# Patient Record
Sex: Male | Born: 1950 | Race: White | Hispanic: No | Marital: Married | Smoking: Never smoker
Health system: Southern US, Community
[De-identification: ages and names within clinical notes are randomized; demographics above are authoritative.]

## PROBLEM LIST (undated history)

## (undated) DIAGNOSIS — E7800 Pure hypercholesterolemia, unspecified: Secondary | ICD-10-CM

## (undated) DIAGNOSIS — M1990 Unspecified osteoarthritis, unspecified site: Secondary | ICD-10-CM

## (undated) DIAGNOSIS — K635 Polyp of colon: Secondary | ICD-10-CM

## (undated) DIAGNOSIS — K649 Unspecified hemorrhoids: Secondary | ICD-10-CM

## (undated) DIAGNOSIS — I10 Essential (primary) hypertension: Secondary | ICD-10-CM

---

## 1998-04-21 HISTORY — PX: REPAIR BIFID DIGIT: SUR1161

## 2002-01-21 ENCOUNTER — Encounter: Payer: Self-pay | Admitting: Emergency Medicine

## 2002-01-21 ENCOUNTER — Emergency Department (HOSPITAL_COMMUNITY): Admission: EM | Admit: 2002-01-21 | Discharge: 2002-01-21 | Payer: Self-pay | Admitting: Emergency Medicine

## 2005-04-21 HISTORY — PX: ROTATOR CUFF REPAIR: SHX139

## 2005-07-08 ENCOUNTER — Encounter (HOSPITAL_COMMUNITY): Admission: RE | Admit: 2005-07-08 | Discharge: 2005-08-07 | Payer: Self-pay | Admitting: Preventative Medicine

## 2011-07-28 ENCOUNTER — Encounter (HOSPITAL_COMMUNITY): Payer: Self-pay | Admitting: Pharmacy Technician

## 2011-07-28 NOTE — Patient Instructions (Addendum)
20 Stanley Reyes  07/28/2011   Your procedure is scheduled on:   08/04/2011   Report to [facility] at  1000  AM.  Call this number if you have problems the morning of surgery: [phone]   Remember:   Do not eat food:After Midnight.  May have clear liquids:until Midnight .  Clear liquids include soda, tea, black coffee, apple or grape juice, broth.  Take these medicines the morning of surgery with A SIP OF WATER: none   Do not wear jewelry, make-up or nail polish.  Do not wear lotions, powders, or perfumes. You may wear deodorant.  Do not shave 48 hours prior to surgery.  Do not bring valuables to the hospital.  Contacts, dentures or bridgework may not be worn into surgery.  Leave suitcase in the car. After surgery it may be brought to your room.  For patients admitted to the hospital, checkout time is 11:00 AM the day of discharge.   Patients discharged the day of surgery will not be allowed to drive home.  Name and phone number of your driver: family  Special Instructions: N/A   Please read over the following fact sheets that you were given: Pain Booklet, MRSA Information, Surgical Site Infection Prevention, Anesthesia Post-op Instructions and Care and Recovery After Surgery Cataract A cataract is a clouding of the lens of the eye. When a lens becomes cloudy, vision is reduced based on the degree and nature of the clouding. Many cataracts reduce vision to some degree. Some cataracts make people more near-sighted as they develop. Other cataracts increase glare. Cataracts that are ignored and become worse can sometimes look white. The white color can be seen through the pupil. CAUSES   Aging. However, cataracts may occur at any age, even in newborns.   Certain drugs.   Trauma to the eye.   Certain diseases such as diabetes.   Specific eye diseases such as chronic inflammation inside the eye or a sudden attack of a rare form of glaucoma.   Inherited or acquired medical  problems.  SYMPTOMS   Gradual, progressive drop in vision in the affected eye.   Severe, rapid visual loss. This most often happens when trauma is the cause.  DIAGNOSIS  To detect a cataract, an eye doctor examines the lens. Cataracts are best diagnosed with an exam of the eyes with the pupils enlarged (dilated) by drops.  TREATMENT  For an early cataract, vision may improve by using different eyeglasses or stronger lighting. If that does not help your vision, surgery is the only effective treatment. A cataract needs to be surgically removed when vision loss interferes with your everyday activities, such as driving, reading, or watching TV. A cataract may also have to be removed if it prevents examination or treatment of another eye problem. Surgery removes the cloudy lens and usually replaces it with a substitute lens (intraocular lens, IOL).  At a time when both you and your doctor agree, the cataract will be surgically removed. If you have cataracts in both eyes, only one is usually removed at a time. This allows the operated eye to heal and be out of danger from any possible problems after surgery (such as infection or poor wound healing). In rare cases, a cataract may be doing damage to your eye. In these cases, your caregiver may advise surgical removal right away. The vast majority of people who have cataract surgery have better vision afterward. HOME CARE INSTRUCTIONS  If you are not planning surgery,  you may be asked to do the following:  Use different eyeglasses.   Use stronger or brighter lighting.   Ask your eye doctor about reducing your medicine dose or changing medicines if it is thought that a medicine caused your cataract. Changing medicines does not make the cataract go away on its own.   Become familiar with your surroundings. Poor vision can lead to injury. Avoid bumping into things on the affected side. You are at a higher risk for tripping or falling.   Exercise extreme  care when driving or operating machinery.   Wear sunglasses if you are sensitive to bright light or experiencing problems with glare.  SEEK IMMEDIATE MEDICAL CARE IF:   You have a worsening or sudden vision loss.   You notice redness, swelling, or increasing pain in the eye.   You have a fever.  Document Released: 04/07/2005 Document Revised: 03/27/2011 Document Reviewed: 11/29/2010 [facility] Patient Information 2012 [place], [place].PATIENT INSTRUCTIONS POST-ANESTHESIA  IMMEDIATELY FOLLOWING SURGERY:  Do not drive or operate machinery for the first twenty four hours after surgery.  Do not make any important decisions for twenty four hours after surgery or while taking narcotic pain medications or sedatives.  If you develop intractable nausea and vomiting or a severe headache please notify your doctor immediately.  FOLLOW-UP:  Please make an appointment with your surgeon as instructed. You do not need to follow up with anesthesia unless specifically instructed to do so.  WOUND CARE INSTRUCTIONS (if applicable):  Keep a dry clean dressing on the anesthesia/puncture wound site if there is drainage.  Once the wound has quit draining you may leave it open to air.  Generally you should leave the bandage intact for twenty four hours unless there is drainage.  If the epidural site drains for more than 36-48 hours please call the anesthesia department.  QUESTIONS?:  Please feel free to call your physician or the hospital operator if you have any questions, and they will be happy to assist you.     [facility] Anesthesia Department [address] [place] [phone]

## 2011-07-29 ENCOUNTER — Encounter (HOSPITAL_COMMUNITY)
Admission: RE | Admit: 2011-07-29 | Discharge: 2011-07-29 | Disposition: A | Discharge status: Home / Self Care | Payer: 59 | Source: Ambulatory Visit | Attending: Ophthalmology | Admitting: Ophthalmology

## 2011-07-29 ENCOUNTER — Encounter (HOSPITAL_COMMUNITY): Payer: Self-pay

## 2011-07-29 HISTORY — DX: Essential (primary) hypertension: I10

## 2011-07-29 LAB — HEMOGLOBIN AND HEMATOCRIT, BLOOD
HCT: 44.1 % (ref 39.0–52.0)
Hemoglobin: 15.3 g/dL (ref 13.0–17.0)

## 2011-07-29 LAB — BASIC METABOLIC PANEL
Calcium: 10.4 mg/dL (ref 8.4–10.5)
Creatinine, Ser: 1.25 mg/dL (ref 0.50–1.35)
GFR calc Af Amer: 70 mL/min — ABNORMAL LOW (ref >90)

## 2011-08-01 MED ORDER — LIDOCAINE HCL 3.5 % OP GEL
OPHTHALMIC | Status: AC
  Filled 2011-08-01: qty 5

## 2011-08-01 MED ORDER — TETRACAINE HCL 0.5 % OP SOLN
OPHTHALMIC | Status: AC
  Filled 2011-08-01: qty 2

## 2011-08-04 ENCOUNTER — Encounter (HOSPITAL_COMMUNITY)
Admission: RE | Disposition: A | Discharge status: Home / Self Care | Payer: Self-pay | Source: Ambulatory Visit | Attending: Ophthalmology

## 2011-08-04 ENCOUNTER — Ambulatory Visit (HOSPITAL_COMMUNITY): Payer: 59 | Admitting: Anesthesiology

## 2011-08-04 ENCOUNTER — Encounter (HOSPITAL_COMMUNITY): Payer: Self-pay | Admitting: Anesthesiology

## 2011-08-04 ENCOUNTER — Encounter (HOSPITAL_COMMUNITY): Payer: Self-pay | Admitting: *Deleted

## 2011-08-04 ENCOUNTER — Ambulatory Visit (HOSPITAL_COMMUNITY)
Admission: RE | Admit: 2011-08-04 | Discharge: 2011-08-04 | Disposition: A | Discharge status: Home / Self Care | Payer: 59 | Source: Ambulatory Visit | Attending: Ophthalmology | Admitting: Ophthalmology

## 2011-08-04 DIAGNOSIS — Z01812 Encounter for preprocedural laboratory examination: Secondary | ICD-10-CM | POA: Insufficient documentation

## 2011-08-04 DIAGNOSIS — H2510 Age-related nuclear cataract, unspecified eye: Secondary | ICD-10-CM | POA: Insufficient documentation

## 2011-08-04 DIAGNOSIS — Z01810 Encounter for preprocedural cardiovascular examination: Secondary | ICD-10-CM | POA: Insufficient documentation

## 2011-08-04 DIAGNOSIS — I10 Essential (primary) hypertension: Secondary | ICD-10-CM | POA: Insufficient documentation

## 2011-08-04 HISTORY — PX: CATARACT EXTRACTION W/PHACO: SHX586

## 2011-08-04 SURGERY — PHACOEMULSIFICATION, CATARACT, WITH IOL INSERTION
Anesthesia: Monitor Anesthesia Care | Site: Eye | Laterality: Right | Wound class: Clean

## 2011-08-04 MED ORDER — TETRACAINE HCL 0.5 % OP SOLN
1.0000 [drp] | OPHTHALMIC | Status: AC
  Administered 2011-08-04: 11:00:00 via OPHTHALMIC

## 2011-08-04 MED ORDER — MIDAZOLAM HCL 2 MG/2ML IJ SOLN
INTRAMUSCULAR | Status: AC
  Administered 2011-08-04: 2 mg
  Filled 2011-08-04: qty 2

## 2011-08-04 MED ORDER — TETRACAINE HCL 0.5 % OP SOLN
OPHTHALMIC | Status: AC
  Filled 2011-08-04: qty 2

## 2011-08-04 MED ORDER — GATIFLOXACIN 0.5 % OP SOLN OPTIME - NO CHARGE
1.0000 [drp] | Freq: Once | OPHTHALMIC | Status: AC
  Administered 2011-08-04: 1 [drp] via OPHTHALMIC
  Filled 2011-08-04: qty 2.5

## 2011-08-04 MED ORDER — BSS IO SOLN
INTRAOCULAR | Status: DC | PRN
  Administered 2011-08-04: 15 mL via INTRAOCULAR

## 2011-08-04 MED ORDER — LIDOCAINE 3.5 % OP GEL OPTIME - NO CHARGE
OPHTHALMIC | Status: DC | PRN
  Administered 2011-08-04: 2 [drp] via OPHTHALMIC

## 2011-08-04 MED ORDER — EPINEPHRINE HCL 1 MG/ML IJ SOLN
INTRAMUSCULAR | Status: AC
  Filled 2011-08-04: qty 1

## 2011-08-04 MED ORDER — MIDAZOLAM HCL 2 MG/2ML IJ SOLN
1.0000 mg | INTRAMUSCULAR | Status: DC | PRN
Start: 2011-08-04 — End: 2011-08-04

## 2011-08-04 MED ORDER — GATIFLOXACIN 0.5 % OP SOLN OPTIME - NO CHARGE
OPHTHALMIC | Status: DC | PRN
  Administered 2011-08-04: 1 [drp] via OPHTHALMIC

## 2011-08-04 MED ORDER — FENTANYL CITRATE 0.05 MG/ML IJ SOLN: 25.0000 ug | INTRAMUSCULAR | Status: DC | PRN

## 2011-08-04 MED ORDER — TETRACAINE 0.5 % OP SOLN OPTIME - NO CHARGE
OPHTHALMIC | Status: DC | PRN
  Administered 2011-08-04: 2 [drp] via OPHTHALMIC

## 2011-08-04 MED ORDER — EPINEPHRINE HCL 1 MG/ML IJ SOLN
INTRAOCULAR | Status: DC | PRN
  Administered 2011-08-04: 11:00:00

## 2011-08-04 MED ORDER — MOXIFLOXACIN HCL 0.5 % OP SOLN - NO CHARGE
1.0000 [drp] | Freq: Once | OPHTHALMIC | Status: AC
  Administered 2011-08-04: 1 [drp] via OPHTHALMIC
  Filled 2011-08-04: qty 3

## 2011-08-04 MED ORDER — NA HYALUR & NA CHOND-NA HYALUR 0.55-0.5 ML IO KIT
PACK | INTRAOCULAR | Status: DC | PRN
  Administered 2011-08-04: 1 via OPHTHALMIC

## 2011-08-04 MED ORDER — KETOROLAC TROMETHAMINE 0.4 % OP SOLN - NO CHARGE
1.0000 [drp] | Freq: Once | OPHTHALMIC | Status: AC
  Administered 2011-08-04: 1 [drp] via OPHTHALMIC
  Filled 2011-08-04: qty 5

## 2011-08-04 MED ORDER — LIDOCAINE HCL 3.5 % OP GEL
OPHTHALMIC | Status: AC
  Filled 2011-08-04: qty 5

## 2011-08-04 MED ORDER — ONDANSETRON HCL 4 MG/2ML IJ SOLN: 4.0000 mg | Freq: Once | INTRAMUSCULAR | Status: DC | PRN

## 2011-08-04 MED ORDER — LACTATED RINGERS IV SOLN
INTRAVENOUS | Status: DC
  Administered 2011-08-04: 10:00:00 via INTRAVENOUS

## 2011-08-04 SURGICAL SUPPLY — 27 items
CAPSULAR TENSION RING-AMO (OPHTHALMIC RELATED) IMPLANT
CLOTH BEACON ORANGE TIMEOUT ST (SAFETY) ×1 IMPLANT
GLOVE BIO SURGEON STRL SZ7.5 (GLOVE) IMPLANT
GLOVE BIOGEL M 6.5 STRL (GLOVE) IMPLANT
GLOVE BIOGEL PI IND STRL 6.5 (GLOVE) IMPLANT
GLOVE BIOGEL PI IND STRL 7.0 (GLOVE) IMPLANT
GLOVE BIOGEL PI INDICATOR 6.5 (GLOVE) ×1
GLOVE BIOGEL PI INDICATOR 7.0 (GLOVE) ×1
GLOVE ECLIPSE 6.5 STRL STRAW (GLOVE) IMPLANT
GLOVE ECLIPSE 7.5 STRL STRAW (GLOVE) IMPLANT
GLOVE EXAM NITRILE LRG STRL (GLOVE) IMPLANT
GLOVE EXAM NITRILE MD LF STRL (GLOVE) IMPLANT
GLOVE SKINSENSE NS SZ6.5 (GLOVE)
GLOVE SKINSENSE NS SZ7.0 (GLOVE)
GLOVE SKINSENSE STRL SZ6.5 (GLOVE) IMPLANT
GLOVE SKINSENSE STRL SZ7.0 (GLOVE) IMPLANT
INST SET CATARACT ~~LOC~~ (KITS) ×2 IMPLANT
KIT VITRECTOMY (OPHTHALMIC RELATED) IMPLANT
PAD ARMBOARD 7.5X6 YLW CONV (MISCELLANEOUS) ×1 IMPLANT
PROC W NO LENS (INTRAOCULAR LENS)
PROC W SPEC LENS (INTRAOCULAR LENS)
PROCESS W NO LENS (INTRAOCULAR LENS) IMPLANT
PROCESS W SPEC LENS (INTRAOCULAR LENS) IMPLANT
RING MALYGIN (MISCELLANEOUS) IMPLANT
SIGHTPATH CAT PROC W REG LENS (Ophthalmic Related) ×2 IMPLANT
VISCOELASTIC ADDITIONAL (OPHTHALMIC RELATED) IMPLANT
WATER STERILE IRR 250ML POUR (IV SOLUTION) ×1 IMPLANT

## 2011-08-04 NOTE — Anesthesia Preprocedure Evaluation (Signed)
Anesthesia Evaluation  Patient identified by MRN, date of birth, ID band Patient awake    Reviewed: Allergy & Precautions, H&P , NPO status , Patient's Chart, lab work & pertinent test results  Airway Mallampati: II      Dental  (+) Teeth Intact   Pulmonary  breath sounds clear to auscultation        Cardiovascular hypertension (stopped his meds), Rhythm:Regular     Neuro/Psych    GI/Hepatic   Endo/Other    Renal/GU      Musculoskeletal   Abdominal   Peds  Hematology   Anesthesia Other Findings   Reproductive/Obstetrics                           Anesthesia Physical Anesthesia Plan  ASA: II  Anesthesia Plan: MAC   Post-op Pain Management:    Induction: Intravenous  Airway Management Planned: Nasal Cannula  Additional Equipment:   Intra-op Plan:   Post-operative Plan:   Informed Consent: I have reviewed the patients History and Physical, chart, labs and discussed the procedure including the risks, benefits and alternatives for the proposed anesthesia with the patient or authorized representative who has indicated his/her understanding and acceptance.     Plan Discussed with:   Anesthesia Plan Comments:         Anesthesia Quick Evaluation

## 2011-08-04 NOTE — Anesthesia Procedure Notes (Signed)
Procedure Name: MAC Date/Time: 08/04/2011 10:49 AM Performed by: Franco Nones Pre-anesthesia Checklist: Patient identified, Emergency Drugs available, Suction available, Timeout performed and Patient being monitored Patient Re-evaluated:Patient Re-evaluated prior to inductionOxygen Delivery Method: Nasal Cannula

## 2011-08-04 NOTE — Discharge Instructions (Signed)
BERYL BALZ 08/04/2011 Dr. Lita Mains Post operative Instructions for Cataract Patients  These instructions are for Stanley Reyes and pertain to the operative eye.  1.  Resume your normal diet and previous oral medicines.  2. Your Follow-up appointment is at Dr. Lita Mains' office in [place] on 08/05/11 at 9:15.  3. You may leave the hospital when your driver is present and your nurse releases you.  4. Begin Pred Forte (prednisolone acetate 1%), Acular LS (ketorolac tromethamine .4%) and Gatifloxacin 0.5% eye drops; 1 drop each 4 times daily to operative eye. Begin 3 hours after discharge from Short Stay Unit.  Moxifloxacin 0.5% may be substituted for Gatifloxacin using the same instructions.  5. Page Dr. Lita Mains via beeper [phone] for significant pain in or around operative eye that is not relieved by Tylenol.  6. If you took Plavix before surgery, restart it at the usual dose on the evening of surgery.  7. Wear dark glasses as necessary for excessive light sensitivity.  8. Do no forcefully rub you your operative eye.  9. Keep your operative eye dry for 1 week. You may gently clean your eyelids with a damp washcloth.  10. You may resume normal occupational activities in one week and resume driving as tolerated after the first post operative visit.  11. It is normal to have blurred vision and a scratchy sensation following surgery.  Dr. Lita Mains: [phone]

## 2011-08-04 NOTE — Anesthesia Postprocedure Evaluation (Signed)
  Anesthesia Post-op Note  Patient: Stanley Reyes  Procedure(s) Performed: Procedure(s) (LRB): CATARACT EXTRACTION PHACO AND INTRAOCULAR LENS PLACEMENT (IOC) (Right)  Patient Location:  Short Stay  Anesthesia Type: MAC  Level of Consciousness: awake  Airway and Oxygen Therapy: Patient Spontanous Breathing  Post-op Pain: none  Post-op Assessment: Post-op Vital signs reviewed, Patient's Cardiovascular Status Stable, Respiratory Function Stable, Patent Airway, No signs of Nausea or vomiting and Pain level controlled  Post-op Vital Signs: Reviewed and stable  Complications: No apparent anesthesia complications

## 2011-08-04 NOTE — Op Note (Signed)
See op note created in another program today

## 2011-08-04 NOTE — H&P (Signed)
I have reviewed the pre printed H&P, the patient was re-examined, and I have identified no significant interval changes in the patient's medical condition.  There is no change in the plan of care since the history and physical of record. 

## 2011-08-04 NOTE — Transfer of Care (Signed)
Immediate Anesthesia Transfer of Care Note  Patient: Stanley Reyes  Procedure(s) Performed: Procedure(s) (LRB): CATARACT EXTRACTION PHACO AND INTRAOCULAR LENS PLACEMENT (IOC) (Right)  Patient Location: Shortstay  Anesthesia Type: MAC  Level of Consciousness: awake  Airway & Oxygen Therapy: Patient Spontanous Breathing   Post-op Assessment: Report given to PACU RN, Post -op Vital signs reviewed and stable and Patient moving all extremities  Post vital signs: Reviewed and stable  Complications: No apparent anesthesia complications

## 2011-08-04 NOTE — Brief Op Note (Signed)
DATE: 08/04/2011   TIME: 11:18 AM   PATIENT:  Corrie Mckusick, 61 y.o., male   PRE-OPERATIVE DIAGNOSIS: nuclear cataract right eye    POST-OPERATIVE DIAGNOSIS:  nuclear cataract right eye    PROCEDURE(S):  Procedure(s): CATARACT EXTRACTION PHACO AND INTRAOCULAR LENS PLACEMENT (IOC)    SURGEON:  Surgeon(s) and Role:    * Susa Simmonds, MD - Primary    ASSISTANT:  Cyndie Chime, RN - Circulator Hurshel Party, CST - Scrub Person Kirstie Peri, RN - Circulator Assistant    ANESTHESIA: Topical and MAC   FINDINGS:  Dense nuclear cataract   IMPLANTS:  Implant Name Type Inv. Item Serial No. Manufacturer Lot No. LRB No. Used Action  SIGHTPATH CAT PROC W REG LENS - [id number] Ophthalmic Related SIGHTPATH CAT PROC W REG LENS [id number] SIGHTPATH   Right 1 Implanted      INDICATIONS: See H&P      PLAN OF CARE:  Discharge home per discharge instructions

## 2011-08-06 ENCOUNTER — Encounter (HOSPITAL_COMMUNITY): Payer: Self-pay | Admitting: Ophthalmology

## 2012-02-10 ENCOUNTER — Telehealth (INDEPENDENT_AMBULATORY_CARE_PROVIDER_SITE_OTHER): Payer: Self-pay | Admitting: *Deleted

## 2012-02-10 ENCOUNTER — Other Ambulatory Visit (INDEPENDENT_AMBULATORY_CARE_PROVIDER_SITE_OTHER): Payer: Self-pay | Admitting: *Deleted

## 2012-02-10 ENCOUNTER — Encounter (INDEPENDENT_AMBULATORY_CARE_PROVIDER_SITE_OTHER): Payer: Self-pay | Admitting: *Deleted

## 2012-02-10 DIAGNOSIS — Z1211 Encounter for screening for malignant neoplasm of colon: Secondary | ICD-10-CM

## 2012-02-10 NOTE — Telephone Encounter (Addendum)
Patient needs movi prep  PCP/Requesting MD: scott luking  Name & DOB: Stanley Reyes 1950-05-08   Procedure: tcs   Reason/Indication:  screening  Has patient had this procedure before?  no  If so, when, by whom and where?    Is there a family history of colon cancer?  no  Who?  What age when diagnosed?    Is patient diabetic?   no      Does patient have prosthetic heart valve?  no  Do you have a pacemaker?  no  Has patient had joint replacement within last 12 months?  no  Is patient on Coumadin, Plavix and/or Aspirin? yes  Medications: asa 81 mg daily, enalapril 10 mg daily, fenofibrate 160 mg daily, fish oil, vitamin  Allergies: nkda  Medication Adjustment: asa 2 days  Procedure date & time: 03/05/12 at 12:25

## 2012-02-12 MED ORDER — PEG-KCL-NACL-NASULF-NA ASC-C 100 G PO SOLR: 1.0000 | Freq: Once | ORAL | Status: DC

## 2012-02-12 NOTE — Telephone Encounter (Signed)
agree

## 2012-03-01 ENCOUNTER — Encounter (HOSPITAL_COMMUNITY): Payer: Self-pay | Admitting: Pharmacy Technician

## 2012-03-05 ENCOUNTER — Ambulatory Visit (HOSPITAL_COMMUNITY)
Admission: RE | Admit: 2012-03-05 | Discharge: 2012-03-05 | Disposition: A | Discharge status: Home / Self Care | Payer: 59 | Source: Ambulatory Visit | Attending: Internal Medicine | Admitting: Internal Medicine

## 2012-03-05 ENCOUNTER — Encounter (HOSPITAL_COMMUNITY): Payer: Self-pay | Admitting: *Deleted

## 2012-03-05 ENCOUNTER — Encounter (HOSPITAL_COMMUNITY)
Admission: RE | Disposition: A | Discharge status: Home / Self Care | Payer: Self-pay | Source: Ambulatory Visit | Attending: Internal Medicine

## 2012-03-05 DIAGNOSIS — K5730 Diverticulosis of large intestine without perforation or abscess without bleeding: Secondary | ICD-10-CM

## 2012-03-05 DIAGNOSIS — K644 Residual hemorrhoidal skin tags: Secondary | ICD-10-CM

## 2012-03-05 DIAGNOSIS — I10 Essential (primary) hypertension: Secondary | ICD-10-CM | POA: Insufficient documentation

## 2012-03-05 DIAGNOSIS — D126 Benign neoplasm of colon, unspecified: Secondary | ICD-10-CM

## 2012-03-05 DIAGNOSIS — Z1211 Encounter for screening for malignant neoplasm of colon: Secondary | ICD-10-CM

## 2012-03-05 HISTORY — PX: COLONOSCOPY: SHX5424

## 2012-03-05 HISTORY — DX: Unspecified hemorrhoids: K64.9

## 2012-03-05 HISTORY — DX: Pure hypercholesterolemia, unspecified: E78.00

## 2012-03-05 SURGERY — COLONOSCOPY
Anesthesia: Moderate Sedation

## 2012-03-05 MED ORDER — MEPERIDINE HCL 50 MG/ML IJ SOLN
INTRAMUSCULAR | Status: AC
  Filled 2012-03-05: qty 1

## 2012-03-05 MED ORDER — STERILE WATER FOR IRRIGATION IR SOLN
Status: DC | PRN
  Administered 2012-03-05: 12:00:00

## 2012-03-05 MED ORDER — MEPERIDINE HCL 50 MG/ML IJ SOLN
INTRAMUSCULAR | Status: DC | PRN
  Administered 2012-03-05 (×2): 25 mg via INTRAVENOUS

## 2012-03-05 MED ORDER — MIDAZOLAM HCL 5 MG/5ML IJ SOLN
INTRAMUSCULAR | Status: DC | PRN
  Administered 2012-03-05 (×3): 2 mg via INTRAVENOUS

## 2012-03-05 MED ORDER — MIDAZOLAM HCL 5 MG/5ML IJ SOLN
INTRAMUSCULAR | Status: AC
  Filled 2012-03-05: qty 10

## 2012-03-05 MED ORDER — SODIUM CHLORIDE 0.45 % IV SOLN
INTRAVENOUS | Status: DC
  Administered 2012-03-05: 12:00:00 via INTRAVENOUS

## 2012-03-05 NOTE — Op Note (Signed)
COLONOSCOPY PROCEDURE REPORT  PATIENT:  [name]  MR#:  [id number] Birthdate:  1950/06/12, 61 y.o., male Endoscopist:  Dr. Malissa Hippo, MD Referred By:  Dr. Lilyan Punt, MD Procedure Date: 03/05/2012  Procedure:   Colonoscopy snare polypectomy.  Indications:  Patient is 61 year-old Caucasian male was undergoing average risk screening colonoscopy.  Informed Consent:  The procedure and risks were reviewed with the patient and informed consent was obtained.  Medications:  Demerol 50 mg IV Versed 6 mg IV  Description of procedure:  After a digital rectal exam was performed, that colonoscope was advanced from the anus through the rectum and colon to the area of the cecum, ileocecal valve and appendiceal orifice. The cecum was deeply intubated. These structures were well-seen and photographed for the record. From the level of the cecum and ileocecal valve, the scope was slowly and cautiously withdrawn. The mucosal surfaces were carefully surveyed utilizing scope tip to flexion to facilitate fold flattening as needed. The scope was pulled down into the rectum where a thorough exam including retroflexion was performed.  Findings:   Prep satisfactory except he had stool coating cecal mucosa. Landmarks were well-seen. Scattered diverticula throughout the colon. Three 6 mm polyps were snared and submitted together. Two were located at ascending colon and the third one was at sigmoid colon. Another small polyp at sigmoid colon was coagulated. Normal rectal mucosa. Hemorrhoids below the dentate line.  Therapeutic/Diagnostic Maneuvers Performed:  The above  Complications:  None  Cecal Withdrawal Time:  25 minutes  Impression:  Examination performed to cecum. Pancolonic diverticulosis. Three polyps were snared submitted together about 6 mm each. Two of these are located at ascending colon and third one was at sigmoid colon. Fourth small polyp was coagulated. External  hemorrhoids.  Recommendations:  Standard instructions given. I will be contacting patient with biopsy results and further recommendations  Drayson Dorko U  03/05/2012 1:03 PM  CC: Dr. Lilyan Punt, MD & Dr. Bonnetta Barry ref. provider found

## 2012-03-05 NOTE — H&P (Signed)
Stanley Reyes is an 61 y.o. male.   Chief Complaint: Patient is here for colonoscopy. HPI: Patient is 61 year old Caucasian male was undergoing colonoscopy. He denies rectal bleeding change in his bowel habits or abdominal pain. Family history is negative for colorectal carcinoma. This is patient's first exam.  Past Medical History  Diagnosis Date  . Hypertension   . Hypercholesteremia   . Hemorrhoids     Past Surgical History  Procedure Date  . Rotator cuff repair 2007  . Repair bifid digit 2000    repair of 3rd and 4th digit.    . Cataract extraction w/phaco 08/04/2011    Procedure: CATARACT EXTRACTION PHACO AND INTRAOCULAR LENS PLACEMENT (IOC);  Surgeon: Susa Simmonds, MD;  Location: [facility];  Service: Ophthalmology;  Laterality: Right;  CDE:  5.60    Family History  Problem Relation Age of Onset  . Colon cancer Neg Hx    Social History:  reports that he has never smoked. He does not have any smokeless tobacco history on file. He reports that he drinks about 3.6 ounces of alcohol per week. He reports that he does not use illicit drugs.  Allergies: No Known Allergies  Medications Prior to Admission  Medication Sig Dispense Refill  . aspirin EC 81 MG tablet Take 81 mg by mouth every morning.      . enalapril (VASOTEC) 10 MG tablet Take 10 mg by mouth daily.      . fenofibrate 160 MG tablet Take 160 mg by mouth daily.      . fish oil-omega-3 fatty acids 1000 MG capsule Take 2 g by mouth every morning.      . Multiple Vitamin (MULITIVITAMIN WITH MINERALS) TABS Take 1 tablet by mouth every morning.      . peg 3350 powder (MOVIPREP) 100 G SOLR Take 1 kit (100 g total) by mouth once.  1 kit  0  . pravastatin (PRAVACHOL) 20 MG tablet Take 20 mg by mouth daily.        No results found for this or any previous visit (from the past 48 hour(s)). No results found.  ROS  Blood pressure 144/87, pulse 67, temperature 97.6 F (36.4 C), resp. rate 23, height 5\' 11"  (1.803 m),  weight 190 lb (86.183 kg), SpO2 100.00%. Physical Exam  Constitutional: He appears well-developed and well-nourished.  HENT:  Mouth/Throat: Oropharynx is clear and moist.  Eyes: Conjunctivae normal are normal. No scleral icterus.  Neck: No thyromegaly present.  Cardiovascular: Normal rate, regular rhythm and normal heart sounds.   No murmur heard. Respiratory: Effort normal.  GI: Soft. He exhibits no distension and no mass. There is no tenderness.  Lymphadenopathy:    He has no cervical adenopathy.  Neurological: He is alert.  Skin: Skin is warm and dry.     Assessment/Plan Average risk screening colonoscopy.  Stanley Reyes U 03/05/2012, 12:17 PM

## 2012-03-09 ENCOUNTER — Encounter (HOSPITAL_COMMUNITY): Payer: Self-pay | Admitting: Internal Medicine

## 2012-03-17 ENCOUNTER — Encounter (INDEPENDENT_AMBULATORY_CARE_PROVIDER_SITE_OTHER): Payer: Self-pay | Admitting: *Deleted

## 2012-11-30 ENCOUNTER — Telehealth: Payer: Self-pay | Admitting: Family Medicine

## 2012-11-30 NOTE — Telephone Encounter (Signed)
Need bw papers for PE on 8/27

## 2012-12-01 ENCOUNTER — Other Ambulatory Visit: Payer: Self-pay

## 2012-12-01 DIAGNOSIS — Z125 Encounter for screening for malignant neoplasm of prostate: Secondary | ICD-10-CM

## 2012-12-01 DIAGNOSIS — E785 Hyperlipidemia, unspecified: Secondary | ICD-10-CM

## 2012-12-01 DIAGNOSIS — Z79899 Other long term (current) drug therapy: Secondary | ICD-10-CM

## 2012-12-01 NOTE — Telephone Encounter (Signed)
Metabolic 7, lipid, liver, PSA 

## 2012-12-01 NOTE — Telephone Encounter (Signed)
Blood work papers ready for pickup. Left message on voicemail notifying patient.

## 2012-12-06 LAB — HEPATIC FUNCTION PANEL
AST: 16 U/L (ref 0–37)
Albumin: 4.4 g/dL (ref 3.5–5.2)
Total Bilirubin: 0.5 mg/dL (ref 0.3–1.2)

## 2012-12-06 LAB — BASIC METABOLIC PANEL
CO2: 29 mEq/L (ref 19–32)
Calcium: 9.7 mg/dL (ref 8.4–10.5)
Chloride: 105 mEq/L (ref 96–112)
Potassium: 4 mEq/L (ref 3.5–5.3)
Sodium: 139 mEq/L (ref 135–145)

## 2012-12-06 LAB — LIPID PANEL
HDL: 38 mg/dL — ABNORMAL LOW (ref >39)
LDL Cholesterol: 85 mg/dL (ref 0–99)
Total CHOL/HDL Ratio: 3.6 Ratio
VLDL: 15 mg/dL (ref 0–40)

## 2012-12-15 ENCOUNTER — Encounter: Payer: Self-pay | Admitting: Family Medicine

## 2012-12-15 ENCOUNTER — Ambulatory Visit (INDEPENDENT_AMBULATORY_CARE_PROVIDER_SITE_OTHER): Payer: 59 | Admitting: Family Medicine

## 2012-12-15 VITALS — BP 142/98 | Ht 70.0 in | Wt 195.2 lb

## 2012-12-15 DIAGNOSIS — Z0000 Encounter for general adult medical examination without abnormal findings: Secondary | ICD-10-CM

## 2012-12-15 DIAGNOSIS — R7303 Prediabetes: Secondary | ICD-10-CM | POA: Insufficient documentation

## 2012-12-15 DIAGNOSIS — I10 Essential (primary) hypertension: Secondary | ICD-10-CM

## 2012-12-15 DIAGNOSIS — E785 Hyperlipidemia, unspecified: Secondary | ICD-10-CM

## 2012-12-15 DIAGNOSIS — R739 Hyperglycemia, unspecified: Secondary | ICD-10-CM

## 2012-12-15 DIAGNOSIS — N289 Disorder of kidney and ureter, unspecified: Secondary | ICD-10-CM

## 2012-12-15 DIAGNOSIS — R7309 Other abnormal glucose: Secondary | ICD-10-CM

## 2012-12-15 NOTE — Progress Notes (Signed)
  Subjective:    Patient ID: Stanley Reyes, male    DOB: [birth year], 62 y.o.   MRN: [id number]  HPI  Here today for annual physical.  No concerns.  The patient comes in today for a wellness visit.  A review of their health history was completed.  A review of medications was also completed. Any necessary refills were discussed. Sensible healthy diet was discussed. Importance of minimizing excessive salt and carbohydrates was also discussed. Safety was stressed including driving, activities at work and at home where applicable. Importance of regular physical activity for overall health was discussed. Preventative measures appropriate for age were discussed. Time was spent with the patient discussing any concerns they have about their well-being.    Review of Systems  Constitutional: Negative for fever, activity change and appetite change.  HENT: Negative for congestion, rhinorrhea and neck pain.   Eyes: Negative for discharge.  Respiratory: Negative for cough and wheezing.   Cardiovascular: Negative for chest pain.  Gastrointestinal: Negative for vomiting, abdominal pain and blood in stool.  Genitourinary: Negative for frequency and difficulty urinating.  Skin: Negative for rash.  Allergic/Immunologic: Negative for environmental allergies and food allergies.  Neurological: Negative for weakness and headaches.  Psychiatric/Behavioral: Negative for agitation.       Objective:   Physical Exam  Constitutional: He appears well-developed and well-nourished.  HENT:  Head: Normocephalic and atraumatic.  Right Ear: External ear normal.  Left Ear: External ear normal.  Nose: Nose normal.  Mouth/Throat: Oropharynx is clear and moist.  Eyes: EOM are normal. Pupils are equal, round, and reactive to light.  Neck: Normal range of motion. Neck supple. No thyromegaly present.  Cardiovascular: Normal rate, regular rhythm and normal heart sounds.   No murmur heard. Pulmonary/Chest:  Effort normal and breath sounds normal. No respiratory distress. He has no wheezes.  Abdominal: Soft. Bowel sounds are normal. He exhibits no distension and no mass. There is no tenderness.  Genitourinary: Rectum normal, prostate normal and penis normal.  Musculoskeletal: Normal range of motion. He exhibits no edema.  Lymphadenopathy:    He has no cervical adenopathy.  Neurological: He is alert. He exhibits normal muscle tone.  Skin: Skin is warm and dry. No erythema.  Psychiatric: He has a normal mood and affect. His behavior is normal. Judgment normal.          Assessment & Plan:  #1 a wellness-discussed importance of regular exercise watching diet safety. Patient up-to-date on colonoscopy. #2 HTN patient's blood pressure tends to run higher in the office when he checks at home looks good continue current medications #3 renal insufficiency recheck metabolic 7 in a few weeks to see if this is a true trend or not. May need different blood pressure medicine. #4 prediabetes-and urged the importance of low starch diet. Recheck A1c in 3 months. #5 hyperlipidemia continue current measures but patient will cut down to 1 fish oil per day.

## 2013-01-12 LAB — BASIC METABOLIC PANEL
BUN: 29 mg/dL — ABNORMAL HIGH (ref 6–23)
Chloride: 109 mEq/L (ref 96–112)
Potassium: 4.2 mEq/L (ref 3.5–5.3)

## 2013-01-17 MED ORDER — AMLODIPINE BESYLATE 5 MG PO TABS: 5.0000 mg | ORAL_TABLET | Freq: Every day | ORAL | Status: DC

## 2013-01-17 NOTE — Addendum Note (Signed)
Addended by: Margaretha Sheffield on: 01/17/2013 09:57 AM   Modules accepted: Orders, Medications

## 2013-01-21 ENCOUNTER — Telehealth: Payer: Self-pay | Admitting: Family Medicine

## 2013-01-21 DIAGNOSIS — Z79899 Other long term (current) drug therapy: Secondary | ICD-10-CM

## 2013-01-21 NOTE — Telephone Encounter (Signed)
Blood work ordered in Epic. Patient notified. 

## 2013-01-21 NOTE — Telephone Encounter (Signed)
States it is times for him to have his blood work completed.  He has an appointment on 02/21/2013 at 8:10am for a follow up appointment.  He would like to have this completed before this appointment.  Please call patient to let him know when he can have this blood work completed.

## 2013-02-11 LAB — BASIC METABOLIC PANEL
CO2: 26 mEq/L (ref 19–32)
Calcium: 9.9 mg/dL (ref 8.4–10.5)
Glucose, Bld: 113 mg/dL — ABNORMAL HIGH (ref 70–99)
Sodium: 140 mEq/L (ref 135–145)

## 2013-02-19 ENCOUNTER — Encounter: Payer: Self-pay | Admitting: *Deleted

## 2013-02-21 ENCOUNTER — Encounter: Payer: Self-pay | Admitting: Family Medicine

## 2013-02-21 ENCOUNTER — Ambulatory Visit (INDEPENDENT_AMBULATORY_CARE_PROVIDER_SITE_OTHER): Payer: 59 | Admitting: Family Medicine

## 2013-02-21 VITALS — BP 130/82 | Ht 70.0 in | Wt 197.8 lb

## 2013-02-21 DIAGNOSIS — N289 Disorder of kidney and ureter, unspecified: Secondary | ICD-10-CM

## 2013-02-21 DIAGNOSIS — I10 Essential (primary) hypertension: Secondary | ICD-10-CM

## 2013-02-21 DIAGNOSIS — Z23 Encounter for immunization: Secondary | ICD-10-CM

## 2013-02-21 MED ORDER — AMLODIPINE BESYLATE 5 MG PO TABS: 5.0000 mg | ORAL_TABLET | Freq: Every day | ORAL | Status: DC

## 2013-02-21 NOTE — Progress Notes (Signed)
  Subjective:    Patient ID: Stanley Reyes, male    DOB: 1950/12/25, 62 y.o.   MRN: [id number]  HPI Patient arrives for a follow up on blood pressure. Patient states been doing well on the new medication denies any particular troubles. No chest tightness pressure pain. PMH hypertension. Patient on medicine the past also has element of whitecoat hypertension recently creatinine was mildly elevated Vasotec was stopped repeat met 7 was done still showed elevated creatinine. Past medical history prediabetes hypertension hyperlipidemia patient does not smoke family history noncontributory  Review of Systems Patient denies headaches chest pain shortness breath rectal bleeding hematuria. He relates he takes his medicine.    Objective:   Physical Exam Lungs are clear no crackles respiratory rate normal heart is regular pulse normal blood pressure 130/80 abdomen soft extremities no edema       Assessment & Plan:  Renal insufficiency-24-hour urine protein and creatinine clearance along with lab work await the results. May need further intervention based on findings may need nephrology input as well. More than likely followup in 3-4 months with repeat lab work continue current medication

## 2013-02-21 NOTE — Patient Instructions (Signed)
Creatinine is 1.6 was 1.13 last year  Walking is good  Low protein diet.      DASH Diet The DASH diet stands for "Dietary Approaches to Stop Hypertension." It is a healthy eating plan that has been shown to reduce high blood pressure (hypertension) in as little as 14 days, while also possibly providing other significant health benefits. These other health benefits include reducing the risk of breast cancer after menopause and reducing the risk of type 2 diabetes, heart disease, colon cancer, and stroke. Health benefits also include weight loss and slowing kidney failure in patients with chronic kidney disease.  DIET GUIDELINES  Limit salt (sodium). Your diet should contain less than 1500 mg of sodium daily.  Limit refined or processed carbohydrates. Your diet should include mostly whole grains. Desserts and added sugars should be used sparingly.  Include small amounts of heart-healthy fats. These types of fats include nuts, oils, and tub margarine. Limit saturated and trans fats. These fats have been shown to be harmful in the body. CHOOSING FOODS  The following food groups are based on a 2000 calorie diet. See your Registered Dietitian for individual calorie needs. Grains and Grain Products (6 to 8 servings daily)  Eat More Often: Whole-wheat bread, brown rice, whole-grain or wheat pasta, quinoa, popcorn without added fat or salt (air popped).  Eat Less Often: White bread, white pasta, white rice, cornbread. Vegetables (4 to 5 servings daily)  Eat More Often: Fresh, frozen, and canned vegetables. Vegetables may be raw, steamed, roasted, or grilled with a minimal amount of fat.  Eat Less Often/Avoid: Creamed or fried vegetables. Vegetables in a cheese sauce. Fruit (4 to 5 servings daily)  Eat More Often: All fresh, canned (in natural juice), or frozen fruits. Dried fruits without added sugar. One hundred percent fruit juice ( cup [237 mL] daily).  Eat Less Often: Dried fruits with  added sugar. Canned fruit in light or heavy syrup. [employer], Fish, and Poultry (2 servings or less daily. One serving is 3 to 4 oz [85-114 g]).  Eat More Often: Ninety percent or leaner ground beef, tenderloin, sirloin. Round cuts of beef, chicken breast, [place] breast. All fish. Grill, bake, or broil your meat. Nothing should be fried.  Eat Less Often/Avoid: Fatty cuts of meat, [place], or chicken leg, thigh, or wing. Fried cuts of meat or fish. Dairy (2 to 3 servings)  Eat More Often: Low-fat or fat-free milk, low-fat plain or light yogurt, reduced-fat or part-skim cheese.  Eat Less Often/Avoid: Milk (whole, 2%).Whole milk yogurt. Full-fat cheeses. Nuts, Seeds, and Legumes (4 to 5 servings per week)  Eat More Often: All without added salt.  Eat Less Often/Avoid: Salted nuts and seeds, canned beans with added salt. Fats and Sweets (limited)  Eat More Often: Vegetable oils, tub margarines without trans fats, sugar-free gelatin. Mayonnaise and salad dressings.  Eat Less Often/Avoid: Coconut oils, palm oils, butter, stick margarine, cream, half and half, cookies, candy, pie. FOR MORE INFORMATION The Dash Diet Eating Plan: www.dashdiet.org Document Released: 03/27/2011 Document Revised: 06/30/2011 Document Reviewed: 03/27/2011 [facility] Patient Information 2014 [place], [place].

## 2013-02-23 LAB — BASIC METABOLIC PANEL
Calcium: 9.7 mg/dL (ref 8.4–10.5)
Chloride: 106 mEq/L (ref 96–112)
Creat: 1.37 mg/dL — ABNORMAL HIGH (ref 0.50–1.35)

## 2013-02-23 LAB — SEDIMENTATION RATE: Sed Rate: 1 mm/hr (ref 0–16)

## 2013-02-24 LAB — ANA: Anti Nuclear Antibody(ANA): NEGATIVE

## 2013-02-24 LAB — PROTEIN, URINE, 24 HOUR: Protein, Urine: <3 mg/dL

## 2013-02-24 LAB — CREATININE CLEARANCE, URINE, 24 HOUR
Creatinine Clearance: 117 mL/min (ref 75–125)
Creatinine, 24H Ur: 2299 mg/d — ABNORMAL HIGH (ref 800–2000)
Creatinine, Urine: 85.2 mg/dL
Creatinine: 1.37 mg/dL — ABNORMAL HIGH (ref 0.50–1.35)

## 2013-06-01 ENCOUNTER — Encounter: Payer: Self-pay | Admitting: Family Medicine

## 2013-06-01 ENCOUNTER — Ambulatory Visit (INDEPENDENT_AMBULATORY_CARE_PROVIDER_SITE_OTHER): Payer: 59 | Admitting: Family Medicine

## 2013-06-01 VITALS — BP 142/92 | Ht 70.0 in | Wt 195.0 lb

## 2013-06-01 DIAGNOSIS — I10 Essential (primary) hypertension: Secondary | ICD-10-CM

## 2013-06-01 DIAGNOSIS — N289 Disorder of kidney and ureter, unspecified: Secondary | ICD-10-CM

## 2013-06-01 NOTE — Progress Notes (Signed)
   Subjective:    Patient ID: Stanley Reyes, male    DOB: 25-Apr-1950, 63 y.o.   MRN: [id number]  HPI Patient is here today for a HTN check up. He relates he's eating a heart healthy diet he does do exercise each morning he does admit that he eats too much red meat. He would try to do more chicken and fish. He does not use excessive salt. He denies chest tightness pressure pain shortness breath denies dysuria. PMH mild renal insufficiency. 24-hour urines overall looked good.    Review of Systems Denies headache shortness of breath coughing vomiting diarrhea. No fevers.    Objective:   Physical Exam  Neck no masses lungs clear no crackles heart is regular pulse normal abdomen soft extremities no edema skin warm dry blood pressure recheck was good 134/82      Assessment & Plan:  HTN good control continue current measures. Low-salt diet regular physical activity.  He will need lab work in early summer including lipid profile/metabolic 7/hemoglobin B4W

## 2013-06-26 ENCOUNTER — Other Ambulatory Visit: Payer: Self-pay | Admitting: Family Medicine

## 2013-07-04 ENCOUNTER — Other Ambulatory Visit: Payer: Self-pay | Admitting: Family Medicine

## 2013-07-22 ENCOUNTER — Ambulatory Visit (HOSPITAL_COMMUNITY)
Admission: RE | Admit: 2013-07-22 | Discharge: 2013-07-22 | Disposition: A | Discharge status: Home / Self Care | Payer: 59 | Source: Ambulatory Visit | Attending: Family Medicine | Admitting: Family Medicine

## 2013-07-22 ENCOUNTER — Encounter: Payer: Self-pay | Admitting: Family Medicine

## 2013-07-22 ENCOUNTER — Ambulatory Visit (INDEPENDENT_AMBULATORY_CARE_PROVIDER_SITE_OTHER): Payer: 59 | Admitting: Family Medicine

## 2013-07-22 VITALS — BP 124/80 | Ht 70.0 in | Wt 197.1 lb

## 2013-07-22 DIAGNOSIS — M25561 Pain in right knee: Secondary | ICD-10-CM

## 2013-07-22 DIAGNOSIS — IMO0002 Reserved for concepts with insufficient information to code with codable children: Secondary | ICD-10-CM

## 2013-07-22 DIAGNOSIS — M1710 Unilateral primary osteoarthritis, unspecified knee: Secondary | ICD-10-CM | POA: Insufficient documentation

## 2013-07-22 DIAGNOSIS — M1711 Unilateral primary osteoarthritis, right knee: Secondary | ICD-10-CM

## 2013-07-22 DIAGNOSIS — M25569 Pain in unspecified knee: Secondary | ICD-10-CM | POA: Insufficient documentation

## 2013-07-22 MED ORDER — ETODOLAC 400 MG PO TABS: 400.0000 mg | ORAL_TABLET | Freq: Two times a day (BID) | ORAL | Status: DC

## 2013-07-22 NOTE — Progress Notes (Signed)
   Subjective:    Patient ID: Stanley Reyes, male    DOB: 1950-07-11, 63 y.o.   MRN: [id number]  Knee Pain  The incident occurred more than 1 week ago. There was no injury mechanism. The pain is present in the right knee. The quality of the pain is described as aching. The pain is at a severity of 10/10. The pain is moderate. The pain has been intermittent since onset. Associated symptoms include an inability to bear weight. He reports no foreign bodies present. The symptoms are aggravated by movement. He has tried NSAIDs for the symptoms. The treatment provided no relief.  Patient states that he has no other concerns at this time.   Just started hurting  Not sure why  Hx of b ball did play a lot of sports some old injuries per tickle in the left knee. In recent weeks  Took ibuprofen prn,  Three  wks duration   Review of Systems Some pain in other joints. No headache no chest pain no back pain no fever ROS otherwise negative    Objective:   Physical Exam  Alert no acute distress significant crepitations palpated in both right and left knee. No obvious joint line tenderness. No obvious effusion. No obvious joint laxity.      Assessment & Plan:  Impression probable flare of arthritis of knee discussed plan anti-inflammatory medicine prescribed. Local measures discussed. X-ray of involved knee. WSL

## 2013-07-23 DIAGNOSIS — M1711 Unilateral primary osteoarthritis, right knee: Secondary | ICD-10-CM | POA: Insufficient documentation

## 2013-08-30 ENCOUNTER — Ambulatory Visit (INDEPENDENT_AMBULATORY_CARE_PROVIDER_SITE_OTHER): Payer: 59 | Admitting: Family Medicine

## 2013-08-30 ENCOUNTER — Encounter: Payer: Self-pay | Admitting: Family Medicine

## 2013-08-30 VITALS — BP 138/90 | Ht 70.0 in | Wt 196.0 lb

## 2013-08-30 DIAGNOSIS — M25569 Pain in unspecified knee: Secondary | ICD-10-CM

## 2013-08-30 NOTE — Progress Notes (Signed)
   Subjective:    Patient ID: Stanley Reyes, male    DOB: 11/17/1950, 63 y.o.   MRN: [id number]  HPIRight knee pain. prescribed etodolac for 2 weeks. Has had xrays done.  Still having a fair amnt of discomfort, comes and goes,  No swelling  Pain is better, but not ideal  Early on very painful but brtter  Review of Systems No other joint pain no chest pain no headache no shortness breath ROS otherwise negative    Objective:   Physical Exam Knee slight crepitations with flexion. No obvious joint laxity. Some diffuse tenderness along anterior knee.  Patient was prepped draped anesthetized and injected with 1 cc Depo-Medrol 2 cc Xylocaine.       Assessment & Plan:  Impression 1 knee pain. Plan await improvement with injection. If does not occur. Return and will need MRI. Local measures discussed.

## 2013-10-13 ENCOUNTER — Other Ambulatory Visit: Payer: Self-pay | Admitting: Family Medicine

## 2013-11-07 ENCOUNTER — Other Ambulatory Visit: Payer: Self-pay | Admitting: Family Medicine

## 2013-11-16 ENCOUNTER — Telehealth: Payer: Self-pay | Admitting: Family Medicine

## 2013-11-16 DIAGNOSIS — E119 Type 2 diabetes mellitus without complications: Secondary | ICD-10-CM

## 2013-11-16 DIAGNOSIS — R5381 Other malaise: Secondary | ICD-10-CM

## 2013-11-16 DIAGNOSIS — Z125 Encounter for screening for malignant neoplasm of prostate: Secondary | ICD-10-CM

## 2013-11-16 DIAGNOSIS — R5383 Other fatigue: Secondary | ICD-10-CM

## 2013-11-16 DIAGNOSIS — Z79899 Other long term (current) drug therapy: Secondary | ICD-10-CM

## 2013-11-16 DIAGNOSIS — E782 Mixed hyperlipidemia: Secondary | ICD-10-CM

## 2013-11-16 NOTE — Telephone Encounter (Signed)
Pt needs labs ordered, has physical appt here 12/27/13, please call when ready

## 2013-11-16 NOTE — Telephone Encounter (Signed)
02/23/13 had: creatinine, 24hr protein, ANA, sed rate, and met 7.   12/06/12 had: lip, liv, met7, psa, and a1c

## 2013-11-20 NOTE — Telephone Encounter (Signed)
Metabolic 7, lipid,  liver, PSA, urine micro-protein, hemoglobin A1c, CBC and followup office visit

## 2013-11-21 NOTE — Telephone Encounter (Signed)
Blood work orders placed in Epic. Patient notified. 

## 2013-11-28 LAB — CBC WITH DIFFERENTIAL/PLATELET
BASOS ABS: 0.1 10*3/uL (ref 0.0–0.1)
Basophils Relative: 1 % (ref 0–1)
Eosinophils Absolute: 0.1 10*3/uL (ref 0.0–0.7)
Eosinophils Relative: 2 % (ref 0–5)
HEMATOCRIT: 40.5 % (ref 39.0–52.0)
Hemoglobin: 14.2 g/dL (ref 13.0–17.0)
LYMPHS ABS: 2.5 10*3/uL (ref 0.7–4.0)
LYMPHS PCT: 36 % (ref 12–46)
MCH: 31.3 pg (ref 26.0–34.0)
MCHC: 35.1 g/dL (ref 30.0–36.0)
MCV: 89.4 fL (ref 78.0–100.0)
MONO ABS: 0.5 10*3/uL (ref 0.1–1.0)
Monocytes Relative: 7 % (ref 3–12)
NEUTROS ABS: 3.7 10*3/uL (ref 1.7–7.7)
Neutrophils Relative %: 54 % (ref 43–77)
Platelets: 224 10*3/uL (ref 150–400)
RBC: 4.53 MIL/uL (ref 4.22–5.81)
RDW: 13.9 % (ref 11.5–15.5)
WBC: 6.9 10*3/uL (ref 4.0–10.5)

## 2013-11-28 LAB — HEMOGLOBIN A1C
Hgb A1c MFr Bld: 6.1 % — ABNORMAL HIGH (ref <5.7)
MEAN PLASMA GLUCOSE: 128 mg/dL — AB (ref <117)

## 2013-11-28 LAB — BASIC METABOLIC PANEL
BUN: 21 mg/dL (ref 6–23)
CALCIUM: 10.1 mg/dL (ref 8.4–10.5)
CO2: 26 meq/L (ref 19–32)
CREATININE: 1.41 mg/dL — AB (ref 0.50–1.35)
Chloride: 105 mEq/L (ref 96–112)
GLUCOSE: 112 mg/dL — AB (ref 70–99)
Potassium: 4.5 mEq/L (ref 3.5–5.3)
Sodium: 141 mEq/L (ref 135–145)

## 2013-11-28 LAB — HEPATIC FUNCTION PANEL
ALK PHOS: 43 U/L (ref 39–117)
ALT: 16 U/L (ref 0–53)
AST: 18 U/L (ref 0–37)
Albumin: 4.7 g/dL (ref 3.5–5.2)
BILIRUBIN DIRECT: 0.1 mg/dL (ref 0.0–0.3)
BILIRUBIN INDIRECT: 0.4 mg/dL (ref 0.2–1.2)
BILIRUBIN TOTAL: 0.5 mg/dL (ref 0.2–1.2)
Total Protein: 6.7 g/dL (ref 6.0–8.3)

## 2013-11-28 LAB — LIPID PANEL
CHOL/HDL RATIO: 3.4 ratio
CHOLESTEROL: 146 mg/dL (ref 0–200)
HDL: 43 mg/dL (ref >39)
LDL Cholesterol: 79 mg/dL (ref 0–99)
Triglycerides: 122 mg/dL (ref <150)
VLDL: 24 mg/dL (ref 0–40)

## 2013-11-29 LAB — MICROALBUMIN, URINE: Microalb, Ur: 0.5 mg/dL (ref 0.00–1.89)

## 2013-11-29 LAB — PSA: PSA: 0.48 ng/mL (ref <=4.00)

## 2013-12-27 ENCOUNTER — Ambulatory Visit (INDEPENDENT_AMBULATORY_CARE_PROVIDER_SITE_OTHER): Payer: Medicare Other | Admitting: Family Medicine

## 2013-12-27 ENCOUNTER — Encounter: Payer: Self-pay | Admitting: Family Medicine

## 2013-12-27 VITALS — BP 152/90 | Ht 70.0 in | Wt 197.2 lb

## 2013-12-27 DIAGNOSIS — E781 Pure hyperglyceridemia: Secondary | ICD-10-CM

## 2013-12-27 DIAGNOSIS — Z0000 Encounter for general adult medical examination without abnormal findings: Secondary | ICD-10-CM

## 2013-12-27 DIAGNOSIS — Z23 Encounter for immunization: Secondary | ICD-10-CM

## 2013-12-27 MED ORDER — PRAVASTATIN SODIUM 20 MG PO TABS: ORAL_TABLET | ORAL | Status: DC

## 2013-12-27 MED ORDER — FLUTICASONE PROPIONATE 50 MCG/ACT NA SUSP: 2.0000 | Freq: Every day | NASAL | Status: DC

## 2013-12-27 MED ORDER — AMLODIPINE BESYLATE 5 MG PO TABS: ORAL_TABLET | ORAL | Status: DC

## 2013-12-27 NOTE — Progress Notes (Addendum)
   Subjective:    Patient ID: Stanley Reyes, male    DOB: December 25, 1950, 63 y.o.   MRN: [id number]  HPI  AWV- Annual Wellness Visit  The patient was seen for their annual wellness visit. The patient's past medical history, surgical history, and family history were reviewed. Pertinent vaccines were reviewed ( tetanus, pneumonia, shingles, flu) The patient's medication list was reviewed and updated.  The height and weight were entered. The patient's current BMI is:28.3  Cognitive screening was completed. Outcome of Mini - Cog: pass  Falls within the past 6 months:none  Current tobacco usage: none (All patients who use tobacco were given written and verbal information on quitting)  Recent listing of emergency department/hospitalizations over the past year were reviewed.  current specialist the patient sees on a regular basis: none   Medicare annual wellness visit patient questionnaire was reviewed.  A written screening schedule for the patient for the next 5-10 years was given. Appropriate discussion of followup regarding next visit was discussed.      Review of Systems  Constitutional: Negative for fever, activity change and appetite change.  HENT: Negative for congestion and rhinorrhea.   Eyes: Negative for discharge.  Respiratory: Negative for cough and wheezing.   Cardiovascular: Negative for chest pain.  Gastrointestinal: Negative for vomiting, abdominal pain and blood in stool.  Genitourinary: Negative for frequency and difficulty urinating.  Musculoskeletal: Negative for neck pain.  Skin: Negative for rash.  Allergic/Immunologic: Negative for environmental allergies and food allergies.  Neurological: Negative for weakness and headaches.  Psychiatric/Behavioral: Negative for agitation.       Objective:   Physical Exam  Constitutional: He appears well-developed and well-nourished.  HENT:  Head: Normocephalic and atraumatic.  Right Ear: External ear normal.    Left Ear: External ear normal.  Nose: Nose normal.  Mouth/Throat: Oropharynx is clear and moist.  Eyes: EOM are normal. Pupils are equal, round, and reactive to light.  Neck: Normal range of motion. Neck supple. No thyromegaly present.  Cardiovascular: Normal rate, regular rhythm and normal heart sounds.   No murmur heard. Pulmonary/Chest: Effort normal and breath sounds normal. No respiratory distress. He has no wheezes.  Abdominal: Soft. Bowel sounds are normal. He exhibits no distension and no mass. There is no tenderness.  Genitourinary: Penis normal.  Musculoskeletal: Normal range of motion. He exhibits no edema.  Lymphadenopathy:    He has no cervical adenopathy.  Neurological: He is alert. He exhibits normal muscle tone.  Skin: Skin is warm and dry. No erythema.  Psychiatric: He has a normal mood and affect. His behavior is normal. Judgment normal.          Assessment & Plan:  I did stop this patient's Fenofibrate his triglycerides look good he is on statin he is taking fish oil I'm hoping that a healthy diet will help keep it under control your triglycerides rise significantly then he will need at least.  Wellness AD reviewed patient eating well dietary should take the continue current measures  Patient to do lab work in 3 months with followup

## 2013-12-27 NOTE — Patient Instructions (Signed)
DASH Eating Plan °DASH stands for "Dietary Approaches to Stop Hypertension." The DASH eating plan is a healthy eating plan that has been shown to reduce high blood pressure (hypertension). Additional health benefits may include reducing the risk of type 2 diabetes mellitus, heart disease, and stroke. The DASH eating plan may also help with weight loss. °WHAT DO I NEED TO KNOW ABOUT THE DASH EATING PLAN? °For the DASH eating plan, you will follow these general guidelines: °· Choose foods with a percent daily value for sodium of less than 5% (as listed on the food label). °· Use salt-free seasonings or herbs instead of table salt or sea salt. °· Check with your health care provider or pharmacist before using salt substitutes. °· Eat lower-sodium products, often labeled as "lower sodium" or "no salt added." °· Eat fresh foods. °· Eat more vegetables, fruits, and low-fat dairy products. °· Choose whole grains. Look for the word "whole" as the first word in the ingredient list. °· Choose fish and skinless chicken or turkey more often than red meat. Limit fish, poultry, and meat to 6 oz (170 g) each day. °· Limit sweets, desserts, sugars, and sugary drinks. °· Choose heart-healthy fats. °· Limit cheese to 1 oz (28 g) per day. °· Eat more home-cooked food and less restaurant, buffet, and fast food. °· Limit fried foods. °· Cook foods using methods other than frying. °· Limit canned vegetables. If you do use them, rinse them well to decrease the sodium. °· When eating at a restaurant, ask that your food be prepared with less salt, or no salt if possible. °WHAT FOODS CAN I EAT? °Seek help from a dietitian for individual calorie needs. °Grains °Whole grain or whole wheat bread. Brown rice. Whole grain or whole wheat pasta. Quinoa, bulgur, and whole grain cereals. Low-sodium cereals. Corn or whole wheat flour tortillas. Whole grain cornbread. Whole grain crackers. Low-sodium crackers. °Vegetables °Fresh or frozen vegetables  (raw, steamed, roasted, or grilled). Low-sodium or reduced-sodium tomato and vegetable juices. Low-sodium or reduced-sodium tomato sauce and paste. Low-sodium or reduced-sodium canned vegetables.  °Fruits °All fresh, canned (in natural juice), or frozen fruits. °Meat and Other Protein Products °Ground beef (85% or leaner), grass-fed beef, or beef trimmed of fat. Skinless chicken or turkey. Ground chicken or turkey. Pork trimmed of fat. All fish and seafood. Eggs. Dried beans, peas, or lentils. Unsalted nuts and seeds. Unsalted canned beans. °Dairy °Low-fat dairy products, such as skim or 1% milk, 2% or reduced-fat cheeses, low-fat ricotta or cottage cheese, or plain low-fat yogurt. Low-sodium or reduced-sodium cheeses. °Fats and Oils °Tub margarines without trans fats. Light or reduced-fat mayonnaise and salad dressings (reduced sodium). Avocado. Safflower, olive, or canola oils. Natural peanut or almond butter. °Other °Unsalted popcorn and pretzels. °The items listed above may not be a complete list of recommended foods or beverages. Contact your dietitian for more options. °WHAT FOODS ARE NOT RECOMMENDED? °Grains °White bread. White pasta. White rice. Refined cornbread. Bagels and croissants. Crackers that contain trans fat. °Vegetables °Creamed or fried vegetables. Vegetables in a cheese sauce. Regular canned vegetables. Regular canned tomato sauce and paste. Regular tomato and vegetable juices. °Fruits °Dried fruits. Canned fruit in light or heavy syrup. Fruit juice. °Meat and Other Protein Products °Fatty cuts of meat. Ribs, chicken wings, bacon, sausage, bologna, salami, chitterlings, fatback, hot dogs, bratwurst, and packaged luncheon meats. Salted nuts and seeds. Canned beans with salt. °Dairy °Whole or 2% milk, cream, half-and-half, and cream cheese. Whole-fat or sweetened yogurt. Full-fat   cheeses or blue cheese. Nondairy creamers and whipped toppings. Processed cheese, cheese spreads, or cheese  curds. °Condiments °Onion and garlic salt, seasoned salt, table salt, and sea salt. Canned and packaged gravies. Worcestershire sauce. Tartar sauce. Barbecue sauce. Teriyaki sauce. Soy sauce, including reduced sodium. Steak sauce. Fish sauce. Oyster sauce. Cocktail sauce. Horseradish. Ketchup and mustard. Meat flavorings and tenderizers. Bouillon cubes. Hot sauce. Tabasco sauce. Marinades. Taco seasonings. Relishes. °Fats and Oils °Butter, stick margarine, lard, shortening, ghee, and bacon fat. Coconut, palm kernel, or palm oils. Regular salad dressings. °Other °Pickles and olives. Salted popcorn and pretzels. °The items listed above may not be a complete list of foods and beverages to avoid. Contact your dietitian for more information. °WHERE CAN I FIND MORE INFORMATION? °National Heart, Lung, and Blood Institute: www.nhlbi.nih.gov/health/health-topics/topics/dash/ °Document Released: 03/27/2011 Document Revised: 08/22/2013 Document Reviewed: 02/09/2013 °ExitCare® Patient Information ©2015 [employer]. This information is not intended to replace advice given to you by your health care provider. Make sure you discuss any questions you have with your health care provider. ° °

## 2014-02-22 LAB — HM DIABETES EYE EXAM

## 2014-03-17 ENCOUNTER — Other Ambulatory Visit: Payer: Self-pay | Admitting: *Deleted

## 2014-03-17 ENCOUNTER — Telehealth: Payer: Self-pay | Admitting: *Deleted

## 2014-03-17 DIAGNOSIS — R7303 Prediabetes: Secondary | ICD-10-CM

## 2014-03-17 DIAGNOSIS — Z79899 Other long term (current) drug therapy: Secondary | ICD-10-CM

## 2014-03-17 NOTE — Telephone Encounter (Signed)
Left message with pt's wife that orders are ready.

## 2014-03-17 NOTE — Telephone Encounter (Signed)
Pt called stating he has a appt dec 8 with Dr. Wolfgang Phoenix pt would like to know if he needs blood work. Please advise [phone]

## 2014-03-17 NOTE — Telephone Encounter (Signed)
Met 7, A1C

## 2014-03-17 NOTE — Telephone Encounter (Signed)
Last BW 11/28/13  Lipid, liver, psa, micro urine, a1c, cbc

## 2014-03-24 LAB — BASIC METABOLIC PANEL
BUN: 18 mg/dL (ref 6–23)
CHLORIDE: 106 meq/L (ref 96–112)
CO2: 27 meq/L (ref 19–32)
Calcium: 9.3 mg/dL (ref 8.4–10.5)
Creat: 1.13 mg/dL (ref 0.50–1.35)
Glucose, Bld: 123 mg/dL — ABNORMAL HIGH (ref 70–99)
POTASSIUM: 4 meq/L (ref 3.5–5.3)
Sodium: 141 mEq/L (ref 135–145)

## 2014-03-24 LAB — HEMOGLOBIN A1C
Hgb A1c MFr Bld: 6 % — ABNORMAL HIGH (ref <5.7)
MEAN PLASMA GLUCOSE: 126 mg/dL — AB (ref <117)

## 2014-03-29 ENCOUNTER — Ambulatory Visit (INDEPENDENT_AMBULATORY_CARE_PROVIDER_SITE_OTHER): Payer: Medicare Other | Admitting: Family Medicine

## 2014-03-29 ENCOUNTER — Encounter: Payer: Self-pay | Admitting: Family Medicine

## 2014-03-29 VITALS — BP 130/90 | Ht 70.0 in | Wt 198.2 lb

## 2014-03-29 DIAGNOSIS — Z23 Encounter for immunization: Secondary | ICD-10-CM | POA: Diagnosis not present

## 2014-03-29 DIAGNOSIS — I10 Essential (primary) hypertension: Secondary | ICD-10-CM | POA: Diagnosis not present

## 2014-03-29 DIAGNOSIS — R7303 Prediabetes: Secondary | ICD-10-CM

## 2014-03-29 NOTE — Patient Instructions (Signed)
DASH Eating Plan °DASH stands for "Dietary Approaches to Stop Hypertension." The DASH eating plan is a healthy eating plan that has been shown to reduce high blood pressure (hypertension). Additional health benefits may include reducing the risk of type 2 diabetes mellitus, heart disease, and stroke. The DASH eating plan may also help with weight loss. °WHAT DO I NEED TO KNOW ABOUT THE DASH EATING PLAN? °For the DASH eating plan, you will follow these general guidelines: °· Choose foods with a percent daily value for sodium of less than 5% (as listed on the food label). °· Use salt-free seasonings or herbs instead of table salt or sea salt. °· Check with your health care provider or pharmacist before using salt substitutes. °· Eat lower-sodium products, often labeled as "lower sodium" or "no salt added." °· Eat fresh foods. °· Eat more vegetables, fruits, and low-fat dairy products. °· Choose whole grains. Look for the word "whole" as the first word in the ingredient list. °· Choose fish and skinless chicken or turkey more often than red meat. Limit fish, poultry, and meat to 6 oz (170 g) each day. °· Limit sweets, desserts, sugars, and sugary drinks. °· Choose heart-healthy fats. °· Limit cheese to 1 oz (28 g) per day. °· Eat more home-cooked food and less restaurant, buffet, and fast food. °· Limit fried foods. °· Cook foods using methods other than frying. °· Limit canned vegetables. If you do use them, rinse them well to decrease the sodium. °· When eating at a restaurant, ask that your food be prepared with less salt, or no salt if possible. °WHAT FOODS CAN I EAT? °Seek help from a dietitian for individual calorie needs. °Grains °Whole grain or whole wheat bread. Brown rice. Whole grain or whole wheat pasta. Quinoa, bulgur, and whole grain cereals. Low-sodium cereals. Corn or whole wheat flour tortillas. Whole grain cornbread. Whole grain crackers. Low-sodium crackers. °Vegetables °Fresh or frozen vegetables  (raw, steamed, roasted, or grilled). Low-sodium or reduced-sodium tomato and vegetable juices. Low-sodium or reduced-sodium tomato sauce and paste. Low-sodium or reduced-sodium canned vegetables.  °Fruits °All fresh, canned (in natural juice), or frozen fruits. °Meat and Other Protein Products °Ground beef (85% or leaner), grass-fed beef, or beef trimmed of fat. Skinless chicken or turkey. Ground chicken or turkey. Pork trimmed of fat. All fish and seafood. Eggs. Dried beans, peas, or lentils. Unsalted nuts and seeds. Unsalted canned beans. °Dairy °Low-fat dairy products, such as skim or 1% milk, 2% or reduced-fat cheeses, low-fat ricotta or cottage cheese, or plain low-fat yogurt. Low-sodium or reduced-sodium cheeses. °Fats and Oils °Tub margarines without trans fats. Light or reduced-fat mayonnaise and salad dressings (reduced sodium). Avocado. Safflower, olive, or canola oils. Natural peanut or almond butter. °Other °Unsalted popcorn and pretzels. °The items listed above may not be a complete list of recommended foods or beverages. Contact your dietitian for more options. °WHAT FOODS ARE NOT RECOMMENDED? °Grains °White bread. White pasta. White rice. Refined cornbread. Bagels and croissants. Crackers that contain trans fat. °Vegetables °Creamed or fried vegetables. Vegetables in a cheese sauce. Regular canned vegetables. Regular canned tomato sauce and paste. Regular tomato and vegetable juices. °Fruits °Dried fruits. Canned fruit in light or heavy syrup. Fruit juice. °Meat and Other Protein Products °Fatty cuts of meat. Ribs, chicken wings, bacon, sausage, bologna, salami, chitterlings, fatback, hot dogs, bratwurst, and packaged luncheon meats. Salted nuts and seeds. Canned beans with salt. °Dairy °Whole or 2% milk, cream, half-and-half, and cream cheese. Whole-fat or sweetened yogurt. Full-fat   cheeses or blue cheese. Nondairy creamers and whipped toppings. Processed cheese, cheese spreads, or cheese  curds. °Condiments °Onion and garlic salt, seasoned salt, table salt, and sea salt. Canned and packaged gravies. Worcestershire sauce. Tartar sauce. Barbecue sauce. Teriyaki sauce. Soy sauce, including reduced sodium. Steak sauce. Fish sauce. Oyster sauce. Cocktail sauce. Horseradish. Ketchup and mustard. Meat flavorings and tenderizers. Bouillon cubes. Hot sauce. Tabasco sauce. Marinades. Taco seasonings. Relishes. °Fats and Oils °Butter, stick margarine, lard, shortening, ghee, and bacon fat. Coconut, palm kernel, or palm oils. Regular salad dressings. °Other °Pickles and olives. Salted popcorn and pretzels. °The items listed above may not be a complete list of foods and beverages to avoid. Contact your dietitian for more information. °WHERE CAN I FIND MORE INFORMATION? °National Heart, Lung, and Blood Institute: www.nhlbi.nih.gov/health/health-topics/topics/dash/ °Document Released: 03/27/2011 Document Revised: 08/22/2013 Document Reviewed: 02/09/2013 °ExitCare® Patient Information ©2015 [employer]. This information is not intended to replace advice given to you by your health care provider. Make sure you discuss any questions you have with your health care provider. ° °

## 2014-03-29 NOTE — Progress Notes (Signed)
   Subjective:    Patient ID: Stanley Reyes, male    DOB: 1950-08-31, 63 y.o.   MRN: [id number]  Hypertension This is a chronic problem. The current episode started more than 1 year ago. The problem has been gradually improving since onset. The problem is controlled. Pertinent negatives include no chest pain. There are no associated agents to hypertension. There are no known risk factors for coronary artery disease. Treatments tried: amlodipine. The current treatment provides significant improvement. There are no compliance problems.    Patient states he has no concerns today.  PMH benign  Review of Systems  Constitutional: Negative for activity change, appetite change and fatigue.  HENT: Negative for congestion.   Respiratory: Negative for cough.   Cardiovascular: Negative for chest pain.  Gastrointestinal: Negative for abdominal pain.  Endocrine: Negative for polydipsia and polyphagia.  Neurological: Negative for weakness.  Psychiatric/Behavioral: Negative for confusion.       Objective:   Physical Exam  Constitutional: He appears well-nourished. No distress.  Cardiovascular: Normal rate, regular rhythm and normal heart sounds.   No murmur heard. Pulmonary/Chest: Effort normal and breath sounds normal. No respiratory distress.  Musculoskeletal: He exhibits no edema.  Lymphadenopathy:    He has no cervical adenopathy.  Neurological: He is alert.  Psychiatric: His behavior is normal.  Vitals reviewed. right knee osteoarthriyis       Assessment & Plan:  Met 7 A1C Lipid Liver in April  Currently kidney function looks improved. Blood pressure acceptable. Watch diet closely. Blood pressure typically goes up in the office but his readings from home looked good he will send [place] readings periodically he will follow-up heart healthy diet stay physically active  Has right knee osteoarthritis there isn't really much can do about this other than Tylenol if it obviously gets  severely worse he may need referral to orthopedics

## 2014-06-30 ENCOUNTER — Other Ambulatory Visit: Payer: Self-pay | Admitting: Family Medicine

## 2014-07-18 ENCOUNTER — Other Ambulatory Visit: Payer: Self-pay | Admitting: Family Medicine

## 2014-07-25 ENCOUNTER — Ambulatory Visit (INDEPENDENT_AMBULATORY_CARE_PROVIDER_SITE_OTHER): Payer: Medicare Other | Admitting: Family Medicine

## 2014-07-25 ENCOUNTER — Encounter: Payer: Self-pay | Admitting: Family Medicine

## 2014-07-25 VITALS — BP 148/82 | Ht 70.0 in | Wt 198.0 lb

## 2014-07-25 DIAGNOSIS — I10 Essential (primary) hypertension: Secondary | ICD-10-CM

## 2014-07-25 DIAGNOSIS — E785 Hyperlipidemia, unspecified: Secondary | ICD-10-CM | POA: Diagnosis not present

## 2014-07-25 DIAGNOSIS — R7309 Other abnormal glucose: Secondary | ICD-10-CM

## 2014-07-25 DIAGNOSIS — R7303 Prediabetes: Secondary | ICD-10-CM

## 2014-07-25 LAB — POCT GLYCOSYLATED HEMOGLOBIN (HGB A1C): HEMOGLOBIN A1C: 5.7

## 2014-07-25 MED ORDER — AMLODIPINE BESYLATE 5 MG PO TABS: 5.0000 mg | ORAL_TABLET | Freq: Every day | ORAL | Status: DC

## 2014-07-25 MED ORDER — FLUTICASONE PROPIONATE 50 MCG/ACT NA SUSP: 2.0000 | Freq: Every day | NASAL | Status: DC

## 2014-07-25 MED ORDER — PRAVASTATIN SODIUM 20 MG PO TABS: 20.0000 mg | ORAL_TABLET | Freq: Every day | ORAL | Status: DC

## 2014-07-25 NOTE — Patient Instructions (Signed)
Check BP at home goals top number 120-130's over 70's-80's   DASH Eating Plan DASH stands for "Dietary Approaches to Stop Hypertension." The DASH eating plan is a healthy eating plan that has been shown to reduce high blood pressure (hypertension). Additional health benefits may include reducing the risk of type 2 diabetes mellitus, heart disease, and stroke. The DASH eating plan may also help with weight loss. WHAT DO I NEED TO KNOW ABOUT THE DASH EATING PLAN? For the DASH eating plan, you will follow these general guidelines:  Choose foods with a percent daily value for sodium of less than 5% (as listed on the food label).  Use salt-free seasonings or herbs instead of table salt or sea salt.  Check with your health care provider or pharmacist before using salt substitutes.  Eat lower-sodium products, often labeled as "lower sodium" or "no salt added."  Eat fresh foods.  Eat more vegetables, fruits, and low-fat dairy products.  Choose whole grains. Look for the word "whole" as the first word in the ingredient list.  Choose fish and skinless chicken or [place] more often than red meat. Limit fish, poultry, and meat to 6 oz (170 g) each day.  Limit sweets, desserts, sugars, and sugary drinks.  Choose heart-healthy fats.  Limit cheese to 1 oz (28 g) per day.  Eat more home-cooked food and less restaurant, buffet, and fast food.  Limit fried foods.  Cook foods using methods other than frying.  Limit canned vegetables. If you do use them, rinse them well to decrease the sodium.  When eating at a restaurant, ask that your food be prepared with less salt, or no salt if possible. WHAT FOODS CAN I EAT? Seek help from a dietitian for individual calorie needs. Grains Whole grain or whole wheat bread. Brown rice. Whole grain or whole wheat pasta. Quinoa, bulgur, and whole grain cereals. Low-sodium cereals. Corn or whole wheat flour tortillas. Whole grain cornbread. Whole grain crackers.  Low-sodium crackers. Vegetables Fresh or frozen vegetables (raw, steamed, roasted, or grilled). Low-sodium or reduced-sodium tomato and vegetable juices. Low-sodium or reduced-sodium tomato sauce and paste. Low-sodium or reduced-sodium canned vegetables.  Fruits All fresh, canned (in natural juice), or frozen fruits. Meat and Other Protein Products Ground beef (85% or leaner), grass-fed beef, or beef trimmed of fat. Skinless chicken or [place]. Ground chicken or [place]. Pork trimmed of fat. All fish and seafood. Eggs. Dried beans, peas, or lentils. Unsalted nuts and seeds. Unsalted canned beans. Dairy Low-fat dairy products, such as skim or 1% milk, 2% or reduced-fat cheeses, low-fat ricotta or cottage cheese, or plain low-fat yogurt. Low-sodium or reduced-sodium cheeses. Fats and Oils Tub margarines without trans fats. Light or reduced-fat mayonnaise and salad dressings (reduced sodium). Avocado. Safflower, olive, or canola oils. Natural peanut or almond butter. Other Unsalted popcorn and pretzels. The items listed above may not be a complete list of recommended foods or beverages. Contact your dietitian for more options. WHAT FOODS ARE NOT RECOMMENDED? Grains White bread. White pasta. White rice. Refined cornbread. Bagels and croissants. Crackers that contain trans fat. Vegetables Creamed or fried vegetables. Vegetables in a cheese sauce. Regular canned vegetables. Regular canned tomato sauce and paste. Regular tomato and vegetable juices. Fruits Dried fruits. Canned fruit in light or heavy syrup. Fruit juice. Meat and Other Protein Products Fatty cuts of meat. Ribs, chicken wings, bacon, sausage, bologna, salami, chitterlings, fatback, hot dogs, bratwurst, and packaged luncheon meats. Salted nuts and seeds. Canned beans with salt. Dairy Whole or  2% milk, cream, half-and-half, and cream cheese. Whole-fat or sweetened yogurt. Full-fat cheeses or blue cheese. Nondairy creamers and whipped  toppings. Processed cheese, cheese spreads, or cheese curds. Condiments Onion and garlic salt, seasoned salt, table salt, and sea salt. Canned and packaged gravies. Worcestershire sauce. Tartar sauce. Barbecue sauce. Teriyaki sauce. Soy sauce, including reduced sodium. Steak sauce. Fish sauce. Oyster sauce. Cocktail sauce. Horseradish. Ketchup and mustard. Meat flavorings and tenderizers. Bouillon cubes. Hot sauce. Tabasco sauce. Marinades. Taco seasonings. Relishes. Fats and Oils Butter, stick margarine, lard, shortening, ghee, and bacon fat. Coconut, palm kernel, or palm oils. Regular salad dressings. Other Pickles and olives. Salted popcorn and pretzels. The items listed above may not be a complete list of foods and beverages to avoid. Contact your dietitian for more information. WHERE CAN I FIND MORE INFORMATION? National Heart, Lung, and Blood Institute: travelstabloid.com Document Released: 03/27/2011 Document Revised: 08/22/2013 Document Reviewed: 02/09/2013 [facility] Patient Information 2015 [place], [place]. This information is not intended to replace advice given to you by your health care provider. Make sure you discuss any questions you have with your health care provider.

## 2014-07-25 NOTE — Progress Notes (Signed)
   Subjective:    Patient ID: Stanley Reyes, male    DOB: May 07, 1950, 64 y.o.   MRN: [id number]  Hypertension This is a chronic problem. The current episode started more than 1 year ago. The problem is unchanged. The problem is controlled. Pertinent negatives include no chest pain, palpitations or shortness of breath. Risk factors for coronary artery disease include dyslipidemia. Past treatments include calcium channel blockers. The current treatment provides significant improvement. Compliance problems include diet.  There is no history of angina.  Hyperlipidemia This is a chronic problem. The current episode started more than 1 year ago. Recent lipid tests were reviewed and are normal. Pertinent negatives include no chest pain or shortness of breath. The current treatment provides significant improvement of lipids. There are no compliance problems.  Risk factors for coronary artery disease include dyslipidemia.  Diabetes He presents for his follow-up diabetic visit. Diabetes type: prediabetes. His disease course has been stable. There are no hypoglycemic associated symptoms. Pertinent negatives for hypoglycemia include no confusion. Pertinent negatives for diabetes include no chest pain, no fatigue, no polydipsia, no polyphagia and no weakness. There are no hypoglycemic complications. There are no diabetic complications. Risk factors for coronary artery disease include dyslipidemia and family history. Current diabetic treatment includes diet. He is compliant with treatment all of the time. He is following a generally healthy diet. Meal planning includes avoidance of concentrated sweets. He has not had a previous visit with a dietitian. He participates in exercise three times a week.   Patient is here today for a 4 month check up.  He needs a refill on his meds.  Seasonal allergies  No concerns    Review of Systems  Constitutional: Negative for activity change, appetite change and fatigue.   HENT: Negative for congestion.   Respiratory: Negative for cough and shortness of breath.   Cardiovascular: Negative for chest pain and palpitations.  Gastrointestinal: Negative for abdominal pain.  Endocrine: Negative for polydipsia and polyphagia.  Neurological: Negative for weakness.  Psychiatric/Behavioral: Negative for confusion.       Objective:   Physical Exam  Constitutional: He appears well-nourished. No distress.  Cardiovascular: Normal rate, regular rhythm and normal heart sounds.   No murmur heard. Pulmonary/Chest: Effort normal and breath sounds normal. No respiratory distress.  Musculoskeletal: He exhibits no edema.  Lymphadenopathy:    He has no cervical adenopathy.  Neurological: He is alert.  Psychiatric: His behavior is normal.  Vitals reviewed.         Assessment & Plan:  zostavax script given Office HTN 1. Prediabetes A1c shows good control watch diet closely stay physically active. - POCT glycosylated hemoglobin (Hb A1C)  2. Essential hypertension, benign Blood pressure overall good it is elevated here in the office but at home the blood pressure readings are good I believe he has some element white: Hypertension continue medication  3. Hyperlipidemia Recent lab work looked good does not need to do comprehensive blood work currently follow-up in a few months for comprehensive lab work and regular physical that will be in September

## 2014-12-08 ENCOUNTER — Telehealth: Payer: Self-pay | Admitting: Family Medicine

## 2014-12-08 DIAGNOSIS — E785 Hyperlipidemia, unspecified: Secondary | ICD-10-CM

## 2014-12-08 DIAGNOSIS — R7303 Prediabetes: Secondary | ICD-10-CM

## 2014-12-08 DIAGNOSIS — I10 Essential (primary) hypertension: Secondary | ICD-10-CM

## 2014-12-08 DIAGNOSIS — N289 Disorder of kidney and ureter, unspecified: Secondary | ICD-10-CM

## 2014-12-08 DIAGNOSIS — Z125 Encounter for screening for malignant neoplasm of prostate: Secondary | ICD-10-CM

## 2014-12-08 NOTE — Telephone Encounter (Signed)
Blood work orders placed in Epic. Patient notified. 

## 2014-12-08 NOTE — Telephone Encounter (Signed)
Last labs 8/15  - Lipid, liver, met 7, HgbA1c, PSA, CBC, Microalbumin urine

## 2014-12-08 NOTE — Telephone Encounter (Signed)
Repeat same testing as before

## 2014-12-08 NOTE — Telephone Encounter (Signed)
Patient has appointment 9/13 for med check needing lab papers.

## 2014-12-20 LAB — BASIC METABOLIC PANEL
BUN/Creatinine Ratio: 15 (ref 10–22)
BUN: 17 mg/dL (ref 8–27)
CALCIUM: 9.7 mg/dL (ref 8.6–10.2)
CO2: 24 mmol/L (ref 18–29)
CREATININE: 1.16 mg/dL (ref 0.76–1.27)
Chloride: 103 mmol/L (ref 97–108)
GFR calc Af Amer: 75 mL/min/{1.73_m2} (ref >59)
GFR, EST NON AFRICAN AMERICAN: 65 mL/min/{1.73_m2} (ref >59)
Glucose: 113 mg/dL — ABNORMAL HIGH (ref 65–99)
Potassium: 4.2 mmol/L (ref 3.5–5.2)
Sodium: 142 mmol/L (ref 134–144)

## 2014-12-20 LAB — MICROALBUMIN / CREATININE URINE RATIO
CREATININE, UR: 96.9 mg/dL
MICROALB/CREAT RATIO: <3.1 mg/g creat (ref 0.0–30.0)
Microalbumin, Urine: <3 ug/mL

## 2014-12-20 LAB — LIPID PANEL
Chol/HDL Ratio: 4.3 ratio units (ref 0.0–5.0)
Cholesterol, Total: 151 mg/dL (ref 100–199)
HDL: 35 mg/dL — AB (ref >39)
LDL Calculated: 85 mg/dL (ref 0–99)
TRIGLYCERIDES: 154 mg/dL — AB (ref 0–149)
VLDL CHOLESTEROL CAL: 31 mg/dL (ref 5–40)

## 2014-12-20 LAB — HEMOGLOBIN A1C
ESTIMATED AVERAGE GLUCOSE: 128 mg/dL
Hgb A1c MFr Bld: 6.1 % — ABNORMAL HIGH (ref 4.8–5.6)

## 2014-12-20 LAB — CBC WITH DIFFERENTIAL/PLATELET
BASOS ABS: 0 10*3/uL (ref 0.0–0.2)
Basos: 0 %
EOS (ABSOLUTE): 0.2 10*3/uL (ref 0.0–0.4)
Eos: 3 %
Hematocrit: 43.8 % (ref 37.5–51.0)
Hemoglobin: 15.2 g/dL (ref 12.6–17.7)
IMMATURE GRANS (ABS): 0 10*3/uL (ref 0.0–0.1)
IMMATURE GRANULOCYTES: 0 %
LYMPHS: 31 %
Lymphocytes Absolute: 2.3 10*3/uL (ref 0.7–3.1)
MCH: 31.5 pg (ref 26.6–33.0)
MCHC: 34.7 g/dL (ref 31.5–35.7)
MCV: 91 fL (ref 79–97)
Monocytes Absolute: 0.5 10*3/uL (ref 0.1–0.9)
Monocytes: 6 %
NEUTROS PCT: 60 %
Neutrophils Absolute: 4.3 10*3/uL (ref 1.4–7.0)
PLATELETS: 189 10*3/uL (ref 150–379)
RBC: 4.82 x10E6/uL (ref 4.14–5.80)
RDW: 13.9 % (ref 12.3–15.4)
WBC: 7.3 10*3/uL (ref 3.4–10.8)

## 2014-12-20 LAB — HEPATIC FUNCTION PANEL
ALT: 18 IU/L (ref 0–44)
AST: 18 IU/L (ref 0–40)
Albumin: 4.5 g/dL (ref 3.6–4.8)
Alkaline Phosphatase: 93 IU/L (ref 39–117)
Bilirubin Total: 0.5 mg/dL (ref 0.0–1.2)
Bilirubin, Direct: 0.14 mg/dL (ref 0.00–0.40)
TOTAL PROTEIN: 7 g/dL (ref 6.0–8.5)

## 2014-12-20 LAB — PSA: PROSTATE SPECIFIC AG, SERUM: 0.5 ng/mL (ref 0.0–4.0)

## 2014-12-26 ENCOUNTER — Ambulatory Visit: Payer: Self-pay | Admitting: Family Medicine

## 2014-12-27 ENCOUNTER — Ambulatory Visit: Payer: Self-pay | Admitting: Family Medicine

## 2015-01-02 ENCOUNTER — Ambulatory Visit: Payer: Self-pay | Admitting: Family Medicine

## 2015-01-03 ENCOUNTER — Ambulatory Visit (INDEPENDENT_AMBULATORY_CARE_PROVIDER_SITE_OTHER): Payer: Medicare Other | Admitting: *Deleted

## 2015-01-03 DIAGNOSIS — Z23 Encounter for immunization: Secondary | ICD-10-CM | POA: Diagnosis not present

## 2015-01-22 ENCOUNTER — Ambulatory Visit (INDEPENDENT_AMBULATORY_CARE_PROVIDER_SITE_OTHER): Payer: Medicare Other | Admitting: Family Medicine

## 2015-01-22 ENCOUNTER — Encounter: Payer: Self-pay | Admitting: Family Medicine

## 2015-01-22 VITALS — BP 142/90 | Ht 70.0 in | Wt 194.0 lb

## 2015-01-22 DIAGNOSIS — E785 Hyperlipidemia, unspecified: Secondary | ICD-10-CM

## 2015-01-22 DIAGNOSIS — I10 Essential (primary) hypertension: Secondary | ICD-10-CM

## 2015-01-22 DIAGNOSIS — Z23 Encounter for immunization: Secondary | ICD-10-CM

## 2015-01-22 DIAGNOSIS — E781 Pure hyperglyceridemia: Secondary | ICD-10-CM

## 2015-01-22 DIAGNOSIS — R7303 Prediabetes: Secondary | ICD-10-CM

## 2015-01-22 MED ORDER — AMLODIPINE BESYLATE 5 MG PO TABS: 5.0000 mg | ORAL_TABLET | Freq: Every day | ORAL | Status: DC

## 2015-01-22 MED ORDER — PRAVASTATIN SODIUM 20 MG PO TABS: 20.0000 mg | ORAL_TABLET | Freq: Every day | ORAL | Status: DC

## 2015-01-22 NOTE — Progress Notes (Signed)
   Subjective:    Patient ID: Stanley Reyes, male    DOB: Aug 21, 1950, 64 y.o.   MRN: [id number]  Hypertension This is a chronic problem. The current episode started more than 1 year ago. Pertinent negatives include no chest pain. Risk factors for coronary artery disease include dyslipidemia and male gender. Treatments tried: norvasc. There are no compliance problems.   Hyperlipidemia This is a chronic problem. The current episode started more than 1 year ago. The problem is controlled. Recent lipid tests were reviewed and are normal. Exacerbating diseases include diabetes. Pertinent negatives include no chest pain. Current antihyperlipidemic treatment includes diet change and statins. The current treatment provides significant improvement of lipids. There are no compliance problems.  Risk factors for coronary artery disease include diabetes mellitus and dyslipidemia.  Diabetes He presents for his follow-up diabetic visit. Diabetes type: prediabetes. Pertinent negatives for hypoglycemia include no confusion. Pertinent negatives for diabetes include no chest pain, no fatigue, no polydipsia, no polyphagia and no weakness. There are no hypoglycemic complications. There are no diabetic complications. Risk factors for coronary artery disease include diabetes mellitus and dyslipidemia. Current diabetic treatment includes diet. He is compliant with treatment all of the time.   Sinus-deviated septum-significant allergy symptoms head congestion drainage coughing denies fever chills sweats nausea vomiting diarrhea  Patient would like to discuss recent lab work  Review of Systems  Constitutional: Negative for activity change, appetite change and fatigue.  HENT: Negative for congestion.   Respiratory: Negative for cough.   Cardiovascular: Negative for chest pain.  Gastrointestinal: Negative for abdominal pain.  Endocrine: Negative for polydipsia and polyphagia.  Neurological: Negative for weakness.    Psychiatric/Behavioral: Negative for confusion.       Objective:   Physical Exam  Constitutional: He appears well-nourished. No distress.  Cardiovascular: Normal rate, regular rhythm and normal heart sounds.   No murmur heard. Pulmonary/Chest: Effort normal and breath sounds normal. No respiratory distress.  Musculoskeletal: He exhibits no edema.  Lymphadenopathy:    He has no cervical adenopathy.  Neurological: He is alert.  Psychiatric: His behavior is normal.  Vitals reviewed.         Assessment & Plan:  1. Essential hypertension, benign Blood pressure looks good on recheck. Patient states blood pressure tends run higher here at the office he checks it at home is actually very good continue current medication.  2. Hyperlipidemia Lipid profile good triglycerides up a little bit LDL looks good continue medication watch diet  3. Prediabetes A1c actually is gone up some needs watch diet exercise try to bring his weight down.  4. Hypertriglyceridemia Triglycerides slightly elevated. Patient was encouraged watch diet exercise bring his weight down continue medications.  5. Need for vaccination Today. - Pneumococcal conjugate vaccine 13-valent IM  Prostate exam completed today up-to-date on colonoscopy. Sinus symptoms may use Flonase when necessary. Follow-up if ongoing troubles

## 2015-01-22 NOTE — Patient Instructions (Signed)
Diabetes Mellitus and Food It is important for you to manage your blood sugar (glucose) level. Your blood glucose level can be greatly affected by what you eat. Eating healthier foods in the appropriate amounts throughout the day at about the same time each day will help you control your blood glucose level. It can also help slow or prevent worsening of your diabetes mellitus. Healthy eating may even help you improve the level of your blood pressure and reach or maintain a healthy weight.  HOW CAN FOOD AFFECT ME? Carbohydrates Carbohydrates affect your blood glucose level more than any other type of food. Your dietitian will help you determine how many carbohydrates to eat at each meal and teach you how to count carbohydrates. Counting carbohydrates is important to keep your blood glucose at a healthy level, especially if you are using insulin or taking certain medicines for diabetes mellitus. Alcohol Alcohol can cause sudden decreases in blood glucose (hypoglycemia), especially if you use insulin or take certain medicines for diabetes mellitus. Hypoglycemia can be a life-threatening condition. Symptoms of hypoglycemia (sleepiness, dizziness, and disorientation) are similar to symptoms of having too much alcohol.  If your health care provider has given you approval to drink alcohol, do so in moderation and use the following guidelines:  Women should not have more than one drink per day, and men should not have more than two drinks per day. One drink is equal to:  12 oz of beer.  5 oz of wine.  1 oz of hard liquor.  Do not drink on an empty stomach.  Keep yourself hydrated. Have water, diet soda, or unsweetened iced tea.  Regular soda, juice, and other mixers might contain a lot of carbohydrates and should be counted. WHAT FOODS ARE NOT RECOMMENDED? As you make food choices, it is important to remember that all foods are not the same. Some foods have fewer nutrients per serving than other  foods, even though they might have the same number of calories or carbohydrates. It is difficult to get your body what it needs when you eat foods with fewer nutrients. Examples of foods that you should avoid that are high in calories and carbohydrates but low in nutrients include:  Trans fats (most processed foods list trans fats on the Nutrition Facts label).  Regular soda.  Juice.  Candy.  Sweets, such as cake, pie, doughnuts, and cookies.  Fried foods. WHAT FOODS CAN I EAT? Have nutrient-rich foods, which will nourish your body and keep you healthy. The food you should eat also will depend on several factors, including:  The calories you need.  The medicines you take.  Your weight.  Your blood glucose level.  Your blood pressure level.  Your cholesterol level. You also should eat a variety of foods, including:  Protein, such as meat, poultry, fish, tofu, nuts, and seeds (lean animal proteins are best).  Fruits.  Vegetables.  Dairy products, such as milk, cheese, and yogurt (low fat is best).  Breads, grains, pasta, cereal, rice, and beans.  Fats such as olive oil, trans fat-free margarine, canola oil, avocado, and olives. DOES EVERYONE WITH DIABETES MELLITUS HAVE THE SAME MEAL PLAN? Because every person with diabetes mellitus is different, there is not one meal plan that works for everyone. It is very important that you meet with a dietitian who will help you create a meal plan that is just right for you. Document Released: 01/02/2005 Document Revised: 04/12/2013 Document Reviewed: 03/04/2013 [employer] Patient Information 2015 [employer]. This   information is not intended to replace advice given to you by your health care provider. Make sure you discuss any questions you have with your health care provider.  

## 2015-05-21 ENCOUNTER — Ambulatory Visit (INDEPENDENT_AMBULATORY_CARE_PROVIDER_SITE_OTHER): Payer: Medicare Other | Admitting: Family Medicine

## 2015-05-21 ENCOUNTER — Encounter: Payer: Self-pay | Admitting: Family Medicine

## 2015-05-21 VITALS — BP 130/88 | Temp 98.5°F | Ht 70.0 in | Wt 197.0 lb

## 2015-05-21 DIAGNOSIS — H8113 Benign paroxysmal vertigo, bilateral: Secondary | ICD-10-CM

## 2015-05-21 DIAGNOSIS — R42 Dizziness and giddiness: Secondary | ICD-10-CM

## 2015-05-21 MED ORDER — MECLIZINE HCL 25 MG PO TABS: 25.0000 mg | ORAL_TABLET | Freq: Three times a day (TID) | ORAL | Status: DC | PRN

## 2015-05-21 NOTE — Progress Notes (Signed)
   Subjective:    Patient ID: Stanley Reyes, male    DOB: 1950-12-05, 65 y.o.   MRN: [id number]  Dizziness This is a recurrent problem. Episode onset: 3 weeks ago. Associated symptoms include congestion, coughing and headaches. Pertinent negatives include no chest pain or fever. Associated symptoms comments: Sinus pressure. Treatments tried: sinus spray.   Significant issues of dizziness started first a couple weeks ago when he is working under a car looking upward and felt dizzy then later he noticed when he rolls over in bed and when he sits up quickly he feels unsteady denies unilateral numbness or weakness   Review of Systems  Constitutional: Negative for fever and activity change.  HENT: Positive for congestion and rhinorrhea. Negative for ear pain.   Eyes: Negative for discharge.  Respiratory: Positive for cough. Negative for wheezing.   Cardiovascular: Negative for chest pain.  Neurological: Positive for dizziness and headaches.   I do not feel the patient has had a stroke.    Objective:   Physical Exam  Constitutional: He appears well-developed.  HENT:  Head: Normocephalic.  Mouth/Throat: Oropharynx is clear and moist. No oropharyngeal exudate.  Neck: Normal range of motion.  Cardiovascular: Normal rate, regular rhythm and normal heart sounds.   No murmur heard. Pulmonary/Chest: Effort normal and breath sounds normal. He has no wheezes.  Lymphadenopathy:    He has no cervical adenopathy.  Neurological: He exhibits normal muscle tone.  Skin: Skin is warm and dry.  Nursing note and vitals reviewed.   Finger to nose is normal lungs are clear no murmurs no unilateral numbness or weakness Romberg negative. EOMI.      Assessment & Plan:  Vertigo symptoms Epley maneuver was shown to him. Some improvement with this will use meclizine when necessary if symptoms not improving over the next couple weeks notify us we will refer to ENT. Patient is not having any symptoms of  stroke

## 2015-05-21 NOTE — Patient Instructions (Signed)
Your procedure is scheduled on: 05/28/2015  Report to [facility] at  28  AM.  Call this number if you have problems the morning of surgery: [phone]   Do not eat food or drink liquids :After Midnight.      Take these medicines the morning of surgery with A SIP OF WATER: norvasc.   Do not wear jewelry, make-up or nail polish.  Do not wear lotions, powders, or perfumes. You may wear deodorant.  Do not shave 48 hours prior to surgery.  Do not bring valuables to the hospital.  Contacts, dentures or bridgework may not be worn into surgery.  Leave suitcase in the car. After surgery it may be brought to your room.  For patients admitted to the hospital, checkout time is 11:00 AM the day of discharge.   Patients discharged the day of surgery will not be allowed to drive home.  :     Please read over the following fact sheets that you were given: Coughing and Deep Breathing, Surgical Site Infection Prevention, Anesthesia Post-op Instructions and Care and Recovery After Surgery    Cataract A cataract is a clouding of the lens of the eye. When a lens becomes cloudy, vision is reduced based on the degree and nature of the clouding. Many cataracts reduce vision to some degree. Some cataracts make people more near-sighted as they develop. Other cataracts increase glare. Cataracts that are ignored and become worse can sometimes look white. The white color can be seen through the pupil. CAUSES   Aging. However, cataracts may occur at any age, even in newborns.   Certain drugs.   Trauma to the eye.   Certain diseases such as diabetes.   Specific eye diseases such as chronic inflammation inside the eye or a sudden attack of a rare form of glaucoma.   Inherited or acquired medical problems.  SYMPTOMS   Gradual, progressive drop in vision in the affected eye.   Severe, rapid visual loss. This most often happens when trauma is the cause.  DIAGNOSIS  To detect a cataract, an eye doctor examines  the lens. Cataracts are best diagnosed with an exam of the eyes with the pupils enlarged (dilated) by drops.  TREATMENT  For an early cataract, vision may improve by using different eyeglasses or stronger lighting. If that does not help your vision, surgery is the only effective treatment. A cataract needs to be surgically removed when vision loss interferes with your everyday activities, such as driving, reading, or watching TV. A cataract may also have to be removed if it prevents examination or treatment of another eye problem. Surgery removes the cloudy lens and usually replaces it with a substitute lens (intraocular lens, IOL).  At a time when both you and your doctor agree, the cataract will be surgically removed. If you have cataracts in both eyes, only one is usually removed at a time. This allows the operated eye to heal and be out of danger from any possible problems after surgery (such as infection or poor wound healing). In rare cases, a cataract may be doing damage to your eye. In these cases, your caregiver may advise surgical removal right away. The vast majority of people who have cataract surgery have better vision afterward. HOME CARE INSTRUCTIONS  If you are not planning surgery, you may be asked to do the following:  Use different eyeglasses.   Use stronger or brighter lighting.   Ask your eye doctor about reducing your medicine dose or  changing medicines if it is thought that a medicine caused your cataract. Changing medicines does not make the cataract go away on its own.   Become familiar with your surroundings. Poor vision can lead to injury. Avoid bumping into things on the affected side. You are at a higher risk for tripping or falling.   Exercise extreme care when driving or operating machinery.   Wear sunglasses if you are sensitive to bright light or experiencing problems with glare.  SEEK IMMEDIATE MEDICAL CARE IF:   You have a worsening or sudden vision loss.    You notice redness, swelling, or increasing pain in the eye.   You have a fever.  Document Released: 04/07/2005 Document Revised: 03/27/2011 Document Reviewed: 11/29/2010 [facility] Patient Information 2012 [place].PATIENT INSTRUCTIONS POST-ANESTHESIA  IMMEDIATELY FOLLOWING SURGERY:  Do not drive or operate machinery for the first twenty four hours after surgery.  Do not make any important decisions for twenty four hours after surgery or while taking narcotic pain medications or sedatives.  If you develop intractable nausea and vomiting or a severe headache please notify your doctor immediately.  FOLLOW-UP:  Please make an appointment with your surgeon as instructed. You do not need to follow up with anesthesia unless specifically instructed to do so.  WOUND CARE INSTRUCTIONS (if applicable):  Keep a dry clean dressing on the anesthesia/puncture wound site if there is drainage.  Once the wound has quit draining you may leave it open to air.  Generally you should leave the bandage intact for twenty four hours unless there is drainage.  If the epidural site drains for more than 36-48 hours please call the anesthesia department.  QUESTIONS?:  Please feel free to call your physician or the hospital operator if you have any questions, and they will be happy to assist you.

## 2015-05-22 ENCOUNTER — Encounter (HOSPITAL_COMMUNITY)
Admission: RE | Admit: 2015-05-22 | Discharge: 2015-05-22 | Disposition: A | Discharge status: Home / Self Care | Payer: Medicare Other | Source: Ambulatory Visit | Attending: Ophthalmology | Admitting: Ophthalmology

## 2015-05-22 ENCOUNTER — Other Ambulatory Visit: Payer: Self-pay

## 2015-05-22 ENCOUNTER — Encounter (HOSPITAL_COMMUNITY): Payer: Self-pay

## 2015-05-22 DIAGNOSIS — H269 Unspecified cataract: Secondary | ICD-10-CM | POA: Insufficient documentation

## 2015-05-22 DIAGNOSIS — Z01810 Encounter for preprocedural cardiovascular examination: Secondary | ICD-10-CM | POA: Insufficient documentation

## 2015-05-22 DIAGNOSIS — Z01812 Encounter for preprocedural laboratory examination: Secondary | ICD-10-CM | POA: Insufficient documentation

## 2015-05-22 HISTORY — DX: Unspecified osteoarthritis, unspecified site: M19.90

## 2015-05-22 LAB — BASIC METABOLIC PANEL
ANION GAP: 8 (ref 5–15)
BUN: 18 mg/dL (ref 6–20)
CALCIUM: 9.7 mg/dL (ref 8.9–10.3)
CO2: 26 mmol/L (ref 22–32)
Chloride: 105 mmol/L (ref 101–111)
Creatinine, Ser: 1.04 mg/dL (ref 0.61–1.24)
GFR calc non Af Amer: >60 mL/min (ref >60)
GLUCOSE: 84 mg/dL (ref 65–99)
Potassium: 4 mmol/L (ref 3.5–5.1)
Sodium: 139 mmol/L (ref 135–145)

## 2015-05-22 LAB — CBC WITH DIFFERENTIAL/PLATELET
BASOS ABS: 0 10*3/uL (ref 0.0–0.1)
BASOS PCT: 0 %
Eosinophils Absolute: 0.1 10*3/uL (ref 0.0–0.7)
Eosinophils Relative: 2 %
HEMATOCRIT: 43.6 % (ref 39.0–52.0)
HEMOGLOBIN: 15.3 g/dL (ref 13.0–17.0)
LYMPHS PCT: 31 %
Lymphs Abs: 2.7 10*3/uL (ref 0.7–4.0)
MCH: 31.9 pg (ref 26.0–34.0)
MCHC: 35.1 g/dL (ref 30.0–36.0)
MCV: 91 fL (ref 78.0–100.0)
MONO ABS: 0.7 10*3/uL (ref 0.1–1.0)
MONOS PCT: 9 %
NEUTROS ABS: 4.9 10*3/uL (ref 1.7–7.7)
NEUTROS PCT: 58 %
Platelets: 173 10*3/uL (ref 150–400)
RBC: 4.79 MIL/uL (ref 4.22–5.81)
RDW: 12.9 % (ref 11.5–15.5)
WBC: 8.4 10*3/uL (ref 4.0–10.5)

## 2015-05-22 NOTE — Pre-Procedure Instructions (Signed)
Patient given iniformation to sign up for my chart at home. 

## 2015-05-28 ENCOUNTER — Ambulatory Visit (HOSPITAL_COMMUNITY)
Admission: RE | Admit: 2015-05-28 | Discharge: 2015-05-28 | Disposition: A | Discharge status: Home / Self Care | Payer: Medicare Other | Source: Ambulatory Visit | Attending: Ophthalmology | Admitting: Ophthalmology

## 2015-05-28 ENCOUNTER — Encounter (HOSPITAL_COMMUNITY)
Admission: RE | Disposition: A | Discharge status: Home / Self Care | Payer: Self-pay | Source: Ambulatory Visit | Attending: Ophthalmology

## 2015-05-28 ENCOUNTER — Encounter (HOSPITAL_COMMUNITY): Payer: Self-pay | Admitting: *Deleted

## 2015-05-28 ENCOUNTER — Ambulatory Visit (HOSPITAL_COMMUNITY): Payer: Medicare Other | Admitting: Anesthesiology

## 2015-05-28 DIAGNOSIS — Z7951 Long term (current) use of inhaled steroids: Secondary | ICD-10-CM | POA: Diagnosis not present

## 2015-05-28 DIAGNOSIS — I10 Essential (primary) hypertension: Secondary | ICD-10-CM | POA: Insufficient documentation

## 2015-05-28 DIAGNOSIS — H268 Other specified cataract: Secondary | ICD-10-CM | POA: Diagnosis not present

## 2015-05-28 DIAGNOSIS — H538 Other visual disturbances: Secondary | ICD-10-CM | POA: Diagnosis not present

## 2015-05-28 DIAGNOSIS — H269 Unspecified cataract: Secondary | ICD-10-CM | POA: Diagnosis not present

## 2015-05-28 DIAGNOSIS — Z79899 Other long term (current) drug therapy: Secondary | ICD-10-CM | POA: Insufficient documentation

## 2015-05-28 DIAGNOSIS — M1991 Primary osteoarthritis, unspecified site: Secondary | ICD-10-CM | POA: Diagnosis not present

## 2015-05-28 DIAGNOSIS — H2512 Age-related nuclear cataract, left eye: Secondary | ICD-10-CM | POA: Diagnosis not present

## 2015-05-28 HISTORY — PX: CATARACT EXTRACTION W/PHACO: SHX586

## 2015-05-28 SURGERY — PHACOEMULSIFICATION, CATARACT, WITH IOL INSERTION
Anesthesia: Monitor Anesthesia Care | Laterality: Left

## 2015-05-28 MED ORDER — BSS IO SOLN
INTRAOCULAR | Status: AC
  Filled 2015-05-28: qty 4

## 2015-05-28 MED ORDER — BSS IO SOLN
INTRAOCULAR | Status: DC | PRN
  Administered 2015-05-28: 15 mL

## 2015-05-28 MED ORDER — LIDOCAINE HCL 3.5 % OP GEL
OPHTHALMIC | Status: DC | PRN
  Administered 2015-05-28: 1 via OPHTHALMIC

## 2015-05-28 MED ORDER — TETRACAINE HCL 0.5 % OP SOLN
1.0000 [drp] | OPHTHALMIC | Status: AC
  Administered 2015-05-28 (×3): 1 [drp] via OPHTHALMIC

## 2015-05-28 MED ORDER — TETRACAINE HCL 0.5 % OP SOLN
OPHTHALMIC | Status: AC
  Filled 2015-05-28: qty 4

## 2015-05-28 MED ORDER — LIDOCAINE HCL 3.5 % OP GEL: 1.0000 "application " | Freq: Once | OPHTHALMIC | Status: DC

## 2015-05-28 MED ORDER — MIDAZOLAM HCL 2 MG/2ML IJ SOLN
INTRAMUSCULAR | Status: AC
  Filled 2015-05-28: qty 2

## 2015-05-28 MED ORDER — POVIDONE-IODINE 5 % OP SOLN
OPHTHALMIC | Status: DC | PRN
  Administered 2015-05-28: 1 via OPHTHALMIC

## 2015-05-28 MED ORDER — NA HYALUR & NA CHOND-NA HYALUR 0.55-0.5 ML IO KIT
PACK | INTRAOCULAR | Status: DC | PRN
  Administered 2015-05-28: 1 via OPHTHALMIC

## 2015-05-28 MED ORDER — TETRACAINE 0.5 % OP SOLN OPTIME - NO CHARGE
OPHTHALMIC | Status: DC | PRN
  Administered 2015-05-28: 1 [drp] via OPHTHALMIC

## 2015-05-28 MED ORDER — MIDAZOLAM HCL 2 MG/2ML IJ SOLN
1.0000 mg | INTRAMUSCULAR | Status: DC | PRN
  Administered 2015-05-28: 2 mg via INTRAVENOUS

## 2015-05-28 MED ORDER — PHENYLEPHRINE-KETOROLAC 1-0.3 % IO SOLN
INTRAOCULAR | Status: DC | PRN
  Administered 2015-05-28: 500 mL via OPHTHALMIC

## 2015-05-28 MED ORDER — LIDOCAINE HCL 3.5 % OP GEL
OPHTHALMIC | Status: AC
  Filled 2015-05-28: qty 1

## 2015-05-28 MED ORDER — LACTATED RINGERS IV SOLN
INTRAVENOUS | Status: DC
  Administered 2015-05-28: 1000 mL via INTRAVENOUS

## 2015-05-28 MED ORDER — CYCLOPENTOLATE-PHENYLEPHRINE 0.2-1 % OP SOLN
1.0000 [drp] | OPHTHALMIC | Status: AC
  Administered 2015-05-28 (×3): 1 [drp] via OPHTHALMIC

## 2015-05-28 MED ORDER — CYCLOPENTOLATE-PHENYLEPHRINE OP SOLN OPTIME - NO CHARGE
OPHTHALMIC | Status: AC
  Filled 2015-05-28: qty 2

## 2015-05-28 SURGICAL SUPPLY — 28 items
CAPSULAR TENSION RING-AMO (OPHTHALMIC RELATED) IMPLANT
CLOTH BEACON ORANGE TIMEOUT ST (SAFETY) ×1 IMPLANT
GLOVE BIO SURGEON STRL SZ7.5 (GLOVE) IMPLANT
GLOVE BIOGEL M 6.5 STRL (GLOVE) IMPLANT
GLOVE BIOGEL PI IND STRL 6.5 (GLOVE) IMPLANT
GLOVE BIOGEL PI IND STRL 7.0 (GLOVE) IMPLANT
GLOVE BIOGEL PI INDICATOR 6.5 (GLOVE)
GLOVE BIOGEL PI INDICATOR 7.0 (GLOVE) ×1
GLOVE ECLIPSE 6.5 STRL STRAW (GLOVE) ×1 IMPLANT
GLOVE ECLIPSE 7.5 STRL STRAW (GLOVE) IMPLANT
GLOVE EXAM NITRILE LRG STRL (GLOVE) IMPLANT
GLOVE EXAM NITRILE MD LF STRL (GLOVE) IMPLANT
GLOVE SKINSENSE NS SZ6.5 (GLOVE)
GLOVE SKINSENSE NS SZ7.0 (GLOVE)
GLOVE SKINSENSE STRL SZ6.5 (GLOVE) IMPLANT
GLOVE SKINSENSE STRL SZ7.0 (GLOVE) IMPLANT
INST SET CATARACT ~~LOC~~ (KITS) ×2 IMPLANT
KIT VITRECTOMY (OPHTHALMIC RELATED) IMPLANT
LENS ALC ACRYL/TECN (Ophthalmic Related) ×2 IMPLANT
PAD ARMBOARD 7.5X6 YLW CONV (MISCELLANEOUS) ×1 IMPLANT
PROC W NO LENS (INTRAOCULAR LENS)
PROC W SPEC LENS (INTRAOCULAR LENS)
PROCESS W NO LENS (INTRAOCULAR LENS) IMPLANT
PROCESS W SPEC LENS (INTRAOCULAR LENS) IMPLANT
RETRACTOR IRIS SIGHTPATH (OPHTHALMIC RELATED) IMPLANT
RING MALYGIN (MISCELLANEOUS) IMPLANT
VISCOELASTIC ADDITIONAL (OPHTHALMIC RELATED) IMPLANT
WATER STERILE IRR 250ML POUR (IV SOLUTION) ×1 IMPLANT

## 2015-05-28 NOTE — Discharge Instructions (Signed)
ARVEY LADER 05/28/2015 Dr. Iona Hansen Post operative Instructions for Cataract Patients  These instructions are for Stanley Reyes and pertain to the operative eye.  1.  Resume your normal diet and previous oral medicines.  2. Your Follow-up appointment is at Dr. Iona Hansen' office in [place] on 05/29/15 @ 3:15pm  3. You may leave the hospital when your driver is present and your nurse releases you.  4. Begin Pred Forte (prednisolone acetate 1%), Acular LS (ketorolac tromethamine .4%) and Gatifloxacin 0.5% eye drops; 1 drop each 4 times daily to operative eye. Begin when you arrive at home today.  Wait 5 minutes between each set of drops.  Moxifloxacin 0.5% may be substituted for Gatifloxacin using the same instructions.  23. Page Dr. Iona Hansen via beeper [phone] for significant pain in or around operative eye that is not relieved by Tylenol.  6. If you took Plavix before surgery, restart it at the usual dose on the evening of surgery.  7. Wear dark glasses as necessary for excessive light sensitivity.  8. Do no forcefully rub you your operative eye.  9. Keep your operative eye dry for 1 week. You may gently clean your eyelids with a damp washcloth.  10. You may resume normal occupational activities in one week and resume driving as tolerated after the first post operative visit.  11. It is normal to have blurred vision and a scratchy sensation following surgery.  Dr. Iona Hansen: [phone]

## 2015-05-28 NOTE — Op Note (Signed)
Delete Bookmark Copy      Expand All Collapse All   05/28/2015  8:16 AM  PATIENT: Stanley Reyes 65 y.o. male  PRE-OPERATIVE DIAGNOSIS: nuclear cataract left eye  POST-OPERATIVE DIAGNOSIS: nuclear cataract left eye  PROCEDURE: Procedure(s):  CATARACT EXTRACTION PHACO AND INTRAOCULAR LENS PLACEMENT ([place])  SURGEON: Surgeon(s):  Williams Che, MD  ASSISTANTS: Zoila Shutter, CST  ANESTHESIA STAFF: Anesthesiologist: Rusty Aus, MD  CRNA: Ollen Bowl, CRNA  ANESTHESIA: topical and MAC  REQUESTED LENS POWER: 24.5  LENS IMPLANT INFORMATION: Alcon [id number] S/n [id number] Exp 09/2019  CUMULATIVE DISSIPATED ENERGY:8.13  INDICATIONS:see scanned office H&P for details  OP FINDINGS:dense NS and PSC  COMPLICATIONS:None  PROCEDURE:  The patient was brought to the operating room in good condition.  The operative eye was prepped and draped in the usual fashion for intraocular surgery.  Lidocaine gel was dropped onto the eye.  A 2.4 mm 10 O'clock near clear corneal stepped incision and a 12 O'clock stab incision were created.  Viscoat was instilled into the anterior chamber.  The 5 mm anterior capsulorhexis was performed with a bent needle cystotome and Utrata forceps.  The lens was hydrodissected and hydrodelineated with a cannula and balanced salt solution and rotated with a Kuglen hook.  Phacoemulsification was perfomed in the divide and conquer technique.  The remaining cortex was removed with I&A and the capsular surfaces polished as necessary.  Provisc was placed into the capsular bag and the lens inserted with the Alcon inserter.  The viscoelastic was removed with I&A and the lens "rocked" into position.  The wounds were hydrated and te anterior chamber was refilled with balanced salt solution.  The wounds were checked for leakage and rehydrated as necessary.  The lid speculum and drapes were removed and the patient was transported to short stay in good condition.   PATIENT DISPOSITION:  Short Stay

## 2015-05-28 NOTE — Anesthesia Postprocedure Evaluation (Signed)
Anesthesia Post Note  Patient: Stanley Reyes  Procedure(s) Performed: Procedure(s) (LRB): CATARACT EXTRACTION PHACO AND INTRAOCULAR LENS PLACEMENT ([facility]) (Left)  Patient location during evaluation: Short Stay Anesthesia Type: MAC Level of consciousness: awake and alert Pain management: pain level controlled Vital Signs Assessment: post-procedure vital signs reviewed and stable Respiratory status: spontaneous breathing Cardiovascular status: blood pressure returned to baseline Postop Assessment: no signs of nausea or vomiting Anesthetic complications: no    Last Vitals:  Filed Vitals:   05/28/15 0725 05/28/15 0730  BP: 114/75 119/80  Temp:    Resp: 13 27    Last Pain: There were no vitals filed for this visit.               Mackenzy Eisenberg

## 2015-05-28 NOTE — H&P (Signed)
I have reviewed the pre printed H&P, the patient was re-examined, and I have identified no significant interval changes in the patient's medical condition.  There is no change in the plan of care since the history and physical of record. 

## 2015-05-28 NOTE — Anesthesia Preprocedure Evaluation (Addendum)
Anesthesia Evaluation  Patient identified by MRN, date of birth, ID band  Reviewed: Allergy & Precautions, NPO status , Patient's Chart, lab work & pertinent test results  Airway Mallampati: II  TM Distance: >3 FB Neck ROM: Full    Dental  (+) Missing,    Pulmonary    Pulmonary exam normal        Cardiovascular hypertension, Pt. on medications Normal cardiovascular exam     Neuro/Psych    GI/Hepatic   Endo/Other    Renal/GU      Musculoskeletal  (+) Arthritis , Osteoarthritis,    Abdominal Normal abdominal exam  (+)   Peds  Hematology   Anesthesia Other Findings   Reproductive/Obstetrics                            Anesthesia Physical Anesthesia Plan  ASA: III  Anesthesia Plan: MAC   Post-op Pain Management:    Induction:   Airway Management Planned: Nasal Cannula  Additional Equipment:   Intra-op Plan:   Post-operative Plan:   Informed Consent: I have reviewed the patients History and Physical, chart, labs and discussed the procedure including the risks, benefits and alternatives for the proposed anesthesia with the patient or authorized representative who has indicated his/her understanding and acceptance.   Dental advisory given  Plan Discussed with: CRNA  Anesthesia Plan Comments:         Anesthesia Quick Evaluation

## 2015-05-28 NOTE — Transfer of Care (Signed)
Immediate Anesthesia Transfer of Care Note  Patient: Stanley Reyes  Procedure(s) Performed: Procedure(s) with comments: CATARACT EXTRACTION PHACO AND INTRAOCULAR LENS PLACEMENT (IOC) (Left) - CDE 8.13  Patient Location: Short Stay  Anesthesia Type:MAC  Level of Consciousness: awake  Airway & Oxygen Therapy: Patient Spontanous Breathing  Post-op Assessment: Report given to RN  Post vital signs: Reviewed  Last Vitals:  Filed Vitals:   05/28/15 0725 05/28/15 0730  BP: 114/75 119/80  Temp:    Resp: 13 27    Complications: No apparent anesthesia complications

## 2015-05-28 NOTE — Brief Op Note (Signed)
05/28/2015  8:16 AM  PATIENT:  Dorothe Pea Alcala  65 y.o. male  PRE-OPERATIVE DIAGNOSIS:  nuclear cataract left eye  POST-OPERATIVE DIAGNOSIS:  nuclear cataract left eye  PROCEDURE:  Procedure(s): CATARACT EXTRACTION PHACO AND INTRAOCULAR LENS PLACEMENT ([place])  SURGEON:  Surgeon(s): Williams Che, MD  ASSISTANTS:   Zoila Shutter, CST  ANESTHESIA STAFF: Anesthesiologist: Rusty Aus, MD CRNA: Ollen Bowl, CRNA  ANESTHESIA:   topical and MAC  REQUESTED LENS POWER: 24.5  LENS IMPLANT INFORMATION:  [employer] [id number]  S/n [id number]  Exp 09/2019  CUMULATIVE DISSIPATED ENERGY:8.13  INDICATIONS:see scanned office H&P for details  OP FINDINGS:dense NS and [place]lstan  COMPLICATIONS:None  DICTATION #: none  PLAN OF CARE:  As above  PATIENT DISPOSITION:  Short Stay

## 2015-05-29 ENCOUNTER — Encounter (HOSPITAL_COMMUNITY): Payer: Self-pay | Admitting: Ophthalmology

## 2015-06-04 ENCOUNTER — Telehealth: Payer: Self-pay | Admitting: Family Medicine

## 2015-06-04 NOTE — Telephone Encounter (Signed)
Pt's insurance will no longer cover the Meclizine tab 25 mg. Pt is needing something that his insurance will cover that is similar to this.     [facility]

## 2015-06-05 NOTE — Telephone Encounter (Signed)
Spoke with patient and informed her per Dr.Scott Luking- It is possible that they are not covering this because he is older than 53. The concern is that meclizine for some people can cause drowsiness which can increase the risk of falling or accidents. It is recommended to use only at home. Some insurance companies will not cover this for anybody 65 and older. It typically is a dirt cheap medicine. If he needs to he may have to pay for this out of pocket. There really is no suitable substitute for this medicine in regards to a generic alternative. If problems let me know-also if having severe dizziness may consider seen ENT. Patient verbalized understanding.

## 2015-06-05 NOTE — Telephone Encounter (Signed)
It is possible that they are not covering this because he is older than 68. The concern is that meclizine for some people can cause drowsiness which can increase the risk of falling or accidents. It is recommended to use only at home. Some insurance companies will not cover this for anybody 52 and older. It typically is a dirt cheap medicine. If he needs to he may have to pay for this out of pocket. There really is no suitable substitute for this medicine in regards to a generic alternative. If problems let me know-also if having severe dizziness may consider seen ENT

## 2015-07-10 ENCOUNTER — Telehealth: Payer: Self-pay | Admitting: Family Medicine

## 2015-07-10 DIAGNOSIS — R42 Dizziness and giddiness: Secondary | ICD-10-CM

## 2015-07-10 NOTE — Telephone Encounter (Signed)
Order for referral put in. Pt notified on voicemail.  

## 2015-07-10 NOTE — Telephone Encounter (Signed)
May refer to ENT. Stanley Reyes is okay if this can help him get in sooner thank you

## 2015-07-10 NOTE — Telephone Encounter (Signed)
Pt calling to say he would like to go ahead with the referral to an ENT as  Soon as possible, the dizzy spells/vertigo feeling is just not going away

## 2015-07-13 ENCOUNTER — Encounter: Payer: Self-pay | Admitting: Family Medicine

## 2015-07-23 ENCOUNTER — Ambulatory Visit (INDEPENDENT_AMBULATORY_CARE_PROVIDER_SITE_OTHER): Payer: Medicare Other | Admitting: Family Medicine

## 2015-07-23 ENCOUNTER — Encounter: Payer: Self-pay | Admitting: Family Medicine

## 2015-07-23 VITALS — BP 132/86 | Ht 70.0 in | Wt 198.0 lb

## 2015-07-23 DIAGNOSIS — E785 Hyperlipidemia, unspecified: Secondary | ICD-10-CM

## 2015-07-23 DIAGNOSIS — R7303 Prediabetes: Secondary | ICD-10-CM | POA: Diagnosis not present

## 2015-07-23 DIAGNOSIS — Z79899 Other long term (current) drug therapy: Secondary | ICD-10-CM

## 2015-07-23 DIAGNOSIS — I10 Essential (primary) hypertension: Secondary | ICD-10-CM | POA: Diagnosis not present

## 2015-07-23 DIAGNOSIS — Z23 Encounter for immunization: Secondary | ICD-10-CM

## 2015-07-23 DIAGNOSIS — T148 Other injury of unspecified body region: Secondary | ICD-10-CM

## 2015-07-23 DIAGNOSIS — R42 Dizziness and giddiness: Secondary | ICD-10-CM

## 2015-07-23 DIAGNOSIS — T148XXA Other injury of unspecified body region, initial encounter: Secondary | ICD-10-CM

## 2015-07-23 MED ORDER — AMLODIPINE BESYLATE 5 MG PO TABS: 5.0000 mg | ORAL_TABLET | Freq: Every day | ORAL | Status: DC

## 2015-07-23 MED ORDER — PRAVASTATIN SODIUM 20 MG PO TABS: 20.0000 mg | ORAL_TABLET | Freq: Every day | ORAL | Status: DC

## 2015-07-23 NOTE — Progress Notes (Signed)
   Subjective:    Patient ID: Stanley Reyes, male    DOB: [birth year], 65 y.o.   MRN: [id number]  Hypertension This is a chronic problem. The current episode started more than 1 year ago. The problem has been gradually improving since onset. Pertinent negatives include no chest pain. There are no associated agents to hypertension. There are no known risk factors for coronary artery disease. Treatments tried: amlodipine. The current treatment provides moderate improvement. There are no compliance problems.    Patient has no concerns at this time. Patient has an appointment with ENT in June for his dizziness.  Patient overall doing well taking his medication trying to eat relatively healthy stays physically active but does not do cardio exercise.  Review of Systems  Constitutional: Negative for activity change, appetite change and fatigue.  HENT: Negative for congestion.   Respiratory: Negative for cough.   Cardiovascular: Negative for chest pain.  Gastrointestinal: Negative for abdominal pain.  Endocrine: Negative for polydipsia and polyphagia.  Neurological: Negative for weakness.  Psychiatric/Behavioral: Negative for confusion.       Objective:   Physical Exam  Constitutional: He appears well-nourished. No distress.  Cardiovascular: Normal rate, regular rhythm and normal heart sounds.   No murmur heard. Pulmonary/Chest: Effort normal and breath sounds normal. No respiratory distress.  Musculoskeletal: He exhibits no edema.  Lymphadenopathy:    He has no cervical adenopathy.  Neurological: He is alert.  Psychiatric: His behavior is normal.  Vitals reviewed.         Assessment & Plan:  Overall health is doing well Blood pressure good control continue current medications Hyperlipidemia-under good control taking current medications check lab work await results Lab work indicated Follow-up in 6 months for lab work and office visit Follow-up sooner if any  problems Patient does have a cut on his left arm should heal well tetanus shot recommended

## 2015-07-31 DIAGNOSIS — Z79899 Other long term (current) drug therapy: Secondary | ICD-10-CM | POA: Diagnosis not present

## 2015-07-31 DIAGNOSIS — E785 Hyperlipidemia, unspecified: Secondary | ICD-10-CM | POA: Diagnosis not present

## 2015-07-31 DIAGNOSIS — R7303 Prediabetes: Secondary | ICD-10-CM | POA: Diagnosis not present

## 2015-08-01 ENCOUNTER — Encounter: Payer: Self-pay | Admitting: Family Medicine

## 2015-08-01 LAB — HEPATIC FUNCTION PANEL
ALT: 24 IU/L (ref 0–44)
AST: 24 IU/L (ref 0–40)
Albumin: 4.6 g/dL (ref 3.6–4.8)
Alkaline Phosphatase: 87 IU/L (ref 39–117)
Bilirubin Total: 0.7 mg/dL (ref 0.0–1.2)
Bilirubin, Direct: 0.18 mg/dL (ref 0.00–0.40)
Total Protein: 7 g/dL (ref 6.0–8.5)

## 2015-08-01 LAB — LIPID PANEL
Chol/HDL Ratio: 3.9 ratio units (ref 0.0–5.0)
Cholesterol, Total: 145 mg/dL (ref 100–199)
HDL: 37 mg/dL — ABNORMAL LOW (ref >39)
LDL Calculated: 78 mg/dL (ref 0–99)
Triglycerides: 148 mg/dL (ref 0–149)
VLDL Cholesterol Cal: 30 mg/dL (ref 5–40)

## 2015-08-01 LAB — HEMOGLOBIN A1C
Est. average glucose Bld gHb Est-mCnc: 120 mg/dL
Hgb A1c MFr Bld: 5.8 % — ABNORMAL HIGH (ref 4.8–5.6)

## 2015-08-01 LAB — BASIC METABOLIC PANEL
BUN/Creatinine Ratio: 18 (ref 10–24)
BUN: 19 mg/dL (ref 8–27)
CO2: 25 mmol/L (ref 18–29)
Calcium: 9.7 mg/dL (ref 8.6–10.2)
Chloride: 103 mmol/L (ref 96–106)
Creatinine, Ser: 1.05 mg/dL (ref 0.76–1.27)
GFR calc Af Amer: 85 mL/min/{1.73_m2} (ref >59)
GFR, EST NON AFRICAN AMERICAN: 74 mL/min/{1.73_m2} (ref >59)
Glucose: 118 mg/dL — ABNORMAL HIGH (ref 65–99)
POTASSIUM: 4.1 mmol/L (ref 3.5–5.2)
SODIUM: 142 mmol/L (ref 134–144)

## 2015-08-29 ENCOUNTER — Other Ambulatory Visit: Payer: Self-pay | Admitting: Family Medicine

## 2015-09-03 DIAGNOSIS — H8111 Benign paroxysmal vertigo, right ear: Secondary | ICD-10-CM | POA: Diagnosis not present

## 2015-09-03 DIAGNOSIS — R42 Dizziness and giddiness: Secondary | ICD-10-CM | POA: Diagnosis not present

## 2015-09-03 DIAGNOSIS — H6123 Impacted cerumen, bilateral: Secondary | ICD-10-CM | POA: Diagnosis not present

## 2015-09-03 DIAGNOSIS — H903 Sensorineural hearing loss, bilateral: Secondary | ICD-10-CM | POA: Diagnosis not present

## 2016-01-22 ENCOUNTER — Encounter: Payer: Self-pay | Admitting: Family Medicine

## 2016-01-22 ENCOUNTER — Ambulatory Visit (INDEPENDENT_AMBULATORY_CARE_PROVIDER_SITE_OTHER): Payer: Medicare Other | Admitting: Family Medicine

## 2016-01-22 VITALS — BP 122/78 | Ht 70.0 in | Wt 196.0 lb

## 2016-01-22 DIAGNOSIS — E782 Mixed hyperlipidemia: Secondary | ICD-10-CM | POA: Diagnosis not present

## 2016-01-22 DIAGNOSIS — I10 Essential (primary) hypertension: Secondary | ICD-10-CM | POA: Diagnosis not present

## 2016-01-22 DIAGNOSIS — Z0000 Encounter for general adult medical examination without abnormal findings: Secondary | ICD-10-CM

## 2016-01-22 DIAGNOSIS — R7303 Prediabetes: Secondary | ICD-10-CM | POA: Diagnosis not present

## 2016-01-22 DIAGNOSIS — Z125 Encounter for screening for malignant neoplasm of prostate: Secondary | ICD-10-CM

## 2016-01-22 DIAGNOSIS — E781 Pure hyperglyceridemia: Secondary | ICD-10-CM

## 2016-01-22 DIAGNOSIS — Z23 Encounter for immunization: Secondary | ICD-10-CM

## 2016-01-22 MED ORDER — TRIAMCINOLONE ACETONIDE 0.1 % EX CREA: 1.0000 "application " | TOPICAL_CREAM | Freq: Two times a day (BID) | CUTANEOUS | 4 refills | Status: DC | PRN

## 2016-01-22 MED ORDER — PRAVASTATIN SODIUM 20 MG PO TABS: ORAL_TABLET | ORAL | 5 refills | Status: DC

## 2016-01-22 MED ORDER — AMLODIPINE BESYLATE 5 MG PO TABS: 5.0000 mg | ORAL_TABLET | Freq: Every day | ORAL | 6 refills | Status: DC

## 2016-01-22 NOTE — Progress Notes (Signed)
   Subjective:    Patient ID: Stanley Reyes, male    DOB: [birth year], 65 y.o.   MRN: [id number]  HPI  The patient comes in today for a wellness visit.    A review of their health history was completed.  A review of medications was also completed.  Any needed refills; Yes  Eating habits: eats good not necessarily healthy  Falls/  MVA accidents in past few months: none  Regular exercise: most mornings  Specialist pt sees on regular basis: none  Preventative health issues were discussed.   Additional concerns: none This patient does have high blood pressure. He does not do walking on a regular basis. Does take his medicine on a regular basis. He states his blood pressure numbers are typically 138 over mid 80s. Denies any chest tightness pressure pain Also has hyperlipidemia takes his medicine does not watch how he eats. Does have some issues with cholesterol.  Review of Systems  Constitutional: Negative for activity change, appetite change and fever.  HENT: Negative for congestion and rhinorrhea.   Eyes: Negative for discharge.  Respiratory: Negative for cough and wheezing.   Cardiovascular: Negative for chest pain.  Gastrointestinal: Negative for abdominal pain, blood in stool and vomiting.  Genitourinary: Negative for difficulty urinating and frequency.  Musculoskeletal: Negative for neck pain.  Skin: Negative for rash.  Allergic/Immunologic: Negative for environmental allergies and food allergies.  Neurological: Negative for weakness and headaches.  Psychiatric/Behavioral: Negative for agitation.       Objective:   Physical Exam  Constitutional: He appears well-developed and well-nourished.  HENT:  Head: Normocephalic and atraumatic.  Right Ear: External ear normal.  Left Ear: External ear normal.  Nose: Nose normal.  Mouth/Throat: Oropharynx is clear and moist.  Eyes: EOM are normal. Pupils are equal, round, and reactive to light.  Neck: Normal range of  motion. Neck supple. No thyromegaly present.  Cardiovascular: Normal rate, regular rhythm and normal heart sounds.   No murmur heard. Pulmonary/Chest: Effort normal and breath sounds normal. No respiratory distress. He has no wheezes.  Abdominal: Soft. Bowel sounds are normal. He exhibits no distension and no mass. There is no tenderness.  Genitourinary: Penis normal.  Musculoskeletal: Normal range of motion. He exhibits no edema.  Lymphadenopathy:    He has no cervical adenopathy.  Neurological: He is alert. He exhibits normal muscle tone.  Skin: Skin is warm and dry. No erythema.  Psychiatric: He has a normal mood and affect. His behavior is normal. Judgment normal.    Prostate exam normal      Assessment & Plan:  Hyperlipidemia-continue medication refills given review his labs reviewed check new labs await the results  Patient with history of prediabetes not watching how he eats we'll need to recheck the A1c   Hypertension blood pressure recheck 136/76 patient overall good control he needs to increase walking on a regular basis watch diet as well  Adult wellness-complete.wellness physical was conducted today. Importance of diet and exercise were discussed in detail. In addition to this a discussion regarding safety was also covered. We also reviewed over immunizations and gave recommendations regarding current immunization needed for age. In addition to this additional areas were also touched on including: Preventative health exams needed: Colonoscopy patient is due for colonoscopy November 2018 due to adenoma Immunizations updated today  Patient was advised yearly wellness exam  Follow-up in 6 months time

## 2016-01-23 LAB — BASIC METABOLIC PANEL
BUN/Creatinine Ratio: 15 (ref 10–24)
BUN: 20 mg/dL (ref 8–27)
CHLORIDE: 104 mmol/L (ref 96–106)
CO2: 25 mmol/L (ref 18–29)
Calcium: 9.9 mg/dL (ref 8.6–10.2)
Creatinine, Ser: 1.3 mg/dL — ABNORMAL HIGH (ref 0.76–1.27)
GFR, EST AFRICAN AMERICAN: 65 mL/min/{1.73_m2} (ref >59)
GFR, EST NON AFRICAN AMERICAN: 56 mL/min/{1.73_m2} — AB (ref >59)
Glucose: 136 mg/dL — ABNORMAL HIGH (ref 65–99)
POTASSIUM: 4.3 mmol/L (ref 3.5–5.2)
SODIUM: 145 mmol/L — AB (ref 134–144)

## 2016-01-23 LAB — LIPID PANEL
CHOLESTEROL TOTAL: 164 mg/dL (ref 100–199)
Chol/HDL Ratio: 4.2 ratio units (ref 0.0–5.0)
HDL: 39 mg/dL — AB (ref >39)
LDL Calculated: 94 mg/dL (ref 0–99)
TRIGLYCERIDES: 154 mg/dL — AB (ref 0–149)
VLDL CHOLESTEROL CAL: 31 mg/dL (ref 5–40)

## 2016-01-23 LAB — HEPATIC FUNCTION PANEL
ALBUMIN: 4.8 g/dL (ref 3.6–4.8)
ALK PHOS: 88 IU/L (ref 39–117)
ALT: 27 IU/L (ref 0–44)
AST: 27 IU/L (ref 0–40)
Bilirubin Total: 0.6 mg/dL (ref 0.0–1.2)
Bilirubin, Direct: 0.16 mg/dL (ref 0.00–0.40)
Total Protein: 7.5 g/dL (ref 6.0–8.5)

## 2016-01-23 LAB — HEMOGLOBIN A1C
ESTIMATED AVERAGE GLUCOSE: 126 mg/dL
Hgb A1c MFr Bld: 6 % — ABNORMAL HIGH (ref 4.8–5.6)

## 2016-01-23 LAB — PSA: Prostate Specific Ag, Serum: 0.9 ng/mL (ref 0.0–4.0)

## 2016-01-24 ENCOUNTER — Other Ambulatory Visit: Payer: Self-pay | Admitting: *Deleted

## 2016-01-24 DIAGNOSIS — N289 Disorder of kidney and ureter, unspecified: Secondary | ICD-10-CM

## 2016-01-24 DIAGNOSIS — R739 Hyperglycemia, unspecified: Secondary | ICD-10-CM

## 2016-07-08 DIAGNOSIS — R739 Hyperglycemia, unspecified: Secondary | ICD-10-CM | POA: Diagnosis not present

## 2016-07-08 DIAGNOSIS — N289 Disorder of kidney and ureter, unspecified: Secondary | ICD-10-CM | POA: Diagnosis not present

## 2016-07-09 ENCOUNTER — Encounter: Payer: Self-pay | Admitting: Family Medicine

## 2016-07-09 LAB — HEMOGLOBIN A1C
ESTIMATED AVERAGE GLUCOSE: 117 mg/dL
HEMOGLOBIN A1C: 5.7 % — AB (ref 4.8–5.6)

## 2016-07-09 LAB — BASIC METABOLIC PANEL
BUN/Creatinine Ratio: 17 (ref 10–24)
BUN: 19 mg/dL (ref 8–27)
CHLORIDE: 102 mmol/L (ref 96–106)
CO2: 24 mmol/L (ref 18–29)
Calcium: 9.7 mg/dL (ref 8.6–10.2)
Creatinine, Ser: 1.15 mg/dL (ref 0.76–1.27)
GFR calc Af Amer: 76 mL/min/{1.73_m2} (ref >59)
GFR calc non Af Amer: 65 mL/min/{1.73_m2} (ref >59)
Glucose: 124 mg/dL — ABNORMAL HIGH (ref 65–99)
POTASSIUM: 4 mmol/L (ref 3.5–5.2)
Sodium: 140 mmol/L (ref 134–144)

## 2016-07-22 ENCOUNTER — Ambulatory Visit (INDEPENDENT_AMBULATORY_CARE_PROVIDER_SITE_OTHER): Payer: Medicare Other | Admitting: Family Medicine

## 2016-07-22 ENCOUNTER — Encounter: Payer: Self-pay | Admitting: Family Medicine

## 2016-07-22 VITALS — BP 130/82 | Ht 70.0 in | Wt 198.0 lb

## 2016-07-22 DIAGNOSIS — I10 Essential (primary) hypertension: Secondary | ICD-10-CM

## 2016-07-22 DIAGNOSIS — E782 Mixed hyperlipidemia: Secondary | ICD-10-CM | POA: Diagnosis not present

## 2016-07-22 MED ORDER — PRAVASTATIN SODIUM 20 MG PO TABS: ORAL_TABLET | ORAL | 6 refills | Status: DC

## 2016-07-22 MED ORDER — AMLODIPINE BESYLATE 5 MG PO TABS: 5.0000 mg | ORAL_TABLET | Freq: Every day | ORAL | 6 refills | Status: DC

## 2016-07-22 NOTE — Progress Notes (Signed)
   Subjective:    Patient ID: Stanley Reyes, male    DOB: 08/21/1950, 67 y.o.   MRN: [id number]  Hypertension  This is a chronic problem. Pertinent negatives include no chest pain. Treatments tried: amlodipine. There are no compliance problems (health conscious with diet, goes to senior center and walks).    Pt states no concerns.  Patient doing well watching diet staying physically active he knows he does much better in the spring summer and fall versus winter time. He denies any other setbacks or problems currently. Denies any chest tightness pressure pain or shortness of breath Needs refills on amlodipine and pravastatin.    Review of Systems  Constitutional: Negative for activity change, appetite change and fatigue.  HENT: Negative for congestion.   Respiratory: Negative for cough.   Cardiovascular: Negative for chest pain.  Gastrointestinal: Negative for abdominal pain.  Endocrine: Negative for polydipsia and polyphagia.  Neurological: Negative for weakness.  Psychiatric/Behavioral: Negative for confusion.       Objective:   Physical Exam  Constitutional: He appears well-nourished. No distress.  Cardiovascular: Normal rate, regular rhythm and normal heart sounds.   No murmur heard. Pulmonary/Chest: Effort normal and breath sounds normal. No respiratory distress.  Musculoskeletal: He exhibits no edema.  Lymphadenopathy:    He has no cervical adenopathy.  Neurological: He is alert.  Psychiatric: His behavior is normal.  Vitals reviewed.         Assessment & Plan:  HTN-The patient's blood pressure overall is good he is compliant with his medicine   Patient was seen today as part of a visit regarding hypertension. The importance of healthy diet and regular physical activity was discussed. The importance of compliance with medications discussed. Ideal goal is to keep blood pressure low elevated levels certainly below 569/79 when possible. The patient was counseled  that keeping blood pressure under control lessen his risk of heart attack, stroke, kidney failure, and early death. The importance of regular follow-ups was discussed with the patient. Low-salt diet such as DASH recommended. Regular physical activity was recommended as well. Patient was advised to keep regular follow-ups.  The patient was seen today as part of an evaluation regarding hyperlipidemia. Recent lab work has been reviewed with the patient as well as the goals for good cholesterol care. Refills were given. No lab work ordered today. Follow-up for wellness in 6 months.   In addition to this medications have been discussed the importance of compliance with diet and medications discussed as well. Patient has been informed of potential side effects of medications in the importance to notify us should any problems occur. Finally the patient is aware that poor control of cholesterol, noncompliance can dramatically increase her risk of heart attack strokes and premature death. The patient will keep regular office visits and the patient does agreed to periodic lab work.

## 2016-07-22 NOTE — Patient Instructions (Signed)
DASH Eating Plan DASH stands for "Dietary Approaches to Stop Hypertension." The DASH eating plan is a healthy eating plan that has been shown to reduce high blood pressure (hypertension). It may also reduce your risk for type 2 diabetes, heart disease, and stroke. The DASH eating plan may also help with weight loss. What are tips for following this plan? General guidelines  Avoid eating more than 2,300 mg (milligrams) of salt (sodium) a day. If you have hypertension, you may need to reduce your sodium intake to 1,500 mg a day.  Limit alcohol intake to no more than 1 drink a day for nonpregnant women and 2 drinks a day for men. One drink equals 12 oz of beer, 5 oz of wine, or 1 oz of hard liquor.  Work with your health care provider to maintain a healthy body weight or to lose weight. Ask what an ideal weight is for you.  Get at least 30 minutes of exercise that causes your heart to beat faster (aerobic exercise) most days of the week. Activities may include walking, swimming, or biking.  Work with your health care provider or diet and nutrition specialist (dietitian) to adjust your eating plan to your individual calorie needs. Reading food labels  Check food labels for the amount of sodium per serving. Choose foods with less than 5 percent of the Daily Value of sodium. Generally, foods with less than 300 mg of sodium per serving fit into this eating plan.  To find whole grains, look for the word "whole" as the first word in the ingredient list. Shopping  Buy products labeled as "low-sodium" or "no salt added."  Buy fresh foods. Avoid canned foods and premade or frozen meals. Cooking  Avoid adding salt when cooking. Use salt-free seasonings or herbs instead of table salt or sea salt. Check with your health care provider or pharmacist before using salt substitutes.  Do not fry foods. Cook foods using healthy methods such as baking, boiling, grilling, and broiling instead.  Cook with  heart-healthy oils, such as olive, canola, soybean, or sunflower oil. Meal planning   Eat a balanced diet that includes: ? 5 or more servings of fruits and vegetables each day. At each meal, try to fill half of your plate with fruits and vegetables. ? Up to 6-8 servings of whole grains each day. ? Less than 6 oz of lean meat, poultry, or fish each day. A 3-oz serving of meat is about the same size as a deck of cards. One egg equals 1 oz. ? 2 servings of low-fat dairy each day. ? A serving of nuts, seeds, or beans 5 times each week. ? Heart-healthy fats. Healthy fats called Omega-3 fatty acids are found in foods such as flaxseeds and coldwater fish, like sardines, salmon, and mackerel.  Limit how much you eat of the following: ? Canned or prepackaged foods. ? Food that is high in trans fat, such as fried foods. ? Food that is high in saturated fat, such as fatty meat. ? Sweets, desserts, sugary drinks, and other foods with added sugar. ? Full-fat dairy products.  Do not salt foods before eating.  Try to eat at least 2 vegetarian meals each week.  Eat more home-cooked food and less restaurant, buffet, and fast food.  When eating at a restaurant, ask that your food be prepared with less salt or no salt, if possible. What foods are recommended? The items listed may not be a complete list. Talk with your dietitian about what   dietary choices are best for you. Grains Whole-grain or whole-wheat bread. Whole-grain or whole-wheat pasta. Brown rice. Oatmeal. Quinoa. Bulgur. Whole-grain and low-sodium cereals. Pita bread. Low-fat, low-sodium crackers. Whole-wheat flour tortillas. Vegetables Fresh or frozen vegetables (raw, steamed, roasted, or grilled). Low-sodium or reduced-sodium tomato and vegetable juice. Low-sodium or reduced-sodium tomato sauce and tomato paste. Low-sodium or reduced-sodium canned vegetables. Fruits All fresh, dried, or frozen fruit. Canned fruit in natural juice (without  added sugar). Meat and other protein foods Skinless chicken or turkey. Ground chicken or turkey. Pork with fat trimmed off. Fish and seafood. Egg whites. Dried beans, peas, or lentils. Unsalted nuts, nut butters, and seeds. Unsalted canned beans. Lean cuts of beef with fat trimmed off. Low-sodium, lean deli meat. Dairy Low-fat (1%) or fat-free (skim) milk. Fat-free, low-fat, or reduced-fat cheeses. Nonfat, low-sodium ricotta or cottage cheese. Low-fat or nonfat yogurt. Low-fat, low-sodium cheese. Fats and oils Soft margarine without trans fats. Vegetable oil. Low-fat, reduced-fat, or light mayonnaise and salad dressings (reduced-sodium). Canola, safflower, olive, soybean, and sunflower oils. Avocado. Seasoning and other foods Herbs. Spices. Seasoning mixes without salt. Unsalted popcorn and pretzels. Fat-free sweets. What foods are not recommended? The items listed may not be a complete list. Talk with your dietitian about what dietary choices are best for you. Grains Baked goods made with fat, such as croissants, muffins, or some breads. Dry pasta or rice meal packs. Vegetables Creamed or fried vegetables. Vegetables in a cheese sauce. Regular canned vegetables (not low-sodium or reduced-sodium). Regular canned tomato sauce and paste (not low-sodium or reduced-sodium). Regular tomato and vegetable juice (not low-sodium or reduced-sodium). Pickles. Olives. Fruits Canned fruit in a light or heavy syrup. Fried fruit. Fruit in cream or butter sauce. Meat and other protein foods Fatty cuts of meat. Ribs. Fried meat. Bacon. Sausage. Bologna and other processed lunch meats. Salami. Fatback. Hotdogs. Bratwurst. Salted nuts and seeds. Canned beans with added salt. Canned or smoked fish. Whole eggs or egg yolks. Chicken or turkey with skin. Dairy Whole or 2% milk, cream, and half-and-half. Whole or full-fat cream cheese. Whole-fat or sweetened yogurt. Full-fat cheese. Nondairy creamers. Whipped toppings.  Processed cheese and cheese spreads. Fats and oils Butter. Stick margarine. Lard. Shortening. Ghee. Bacon fat. Tropical oils, such as coconut, palm kernel, or palm oil. Seasoning and other foods Salted popcorn and pretzels. Onion salt, garlic salt, seasoned salt, table salt, and sea salt. Worcestershire sauce. Tartar sauce. Barbecue sauce. Teriyaki sauce. Soy sauce, including reduced-sodium. Steak sauce. Canned and packaged gravies. Fish sauce. Oyster sauce. Cocktail sauce. Horseradish that you find on the shelf. Ketchup. Mustard. Meat flavorings and tenderizers. Bouillon cubes. Hot sauce and Tabasco sauce. Premade or packaged marinades. Premade or packaged taco seasonings. Relishes. Regular salad dressings. Where to find more information:  National Heart, Lung, and Blood Institute: www.nhlbi.nih.gov  American Heart Association: www.heart.org Summary  The DASH eating plan is a healthy eating plan that has been shown to reduce high blood pressure (hypertension). It may also reduce your risk for type 2 diabetes, heart disease, and stroke.  With the DASH eating plan, you should limit salt (sodium) intake to 2,300 mg a day. If you have hypertension, you may need to reduce your sodium intake to 1,500 mg a day.  When on the DASH eating plan, aim to eat more fresh fruits and vegetables, whole grains, lean proteins, low-fat dairy, and heart-healthy fats.  Work with your health care provider or diet and nutrition specialist (dietitian) to adjust your eating plan to your individual   calorie needs. This information is not intended to replace advice given to you by your health care provider. Make sure you discuss any questions you have with your health care provider. Document Released: 03/27/2011 Document Revised: 03/31/2016 Document Reviewed: 03/31/2016 [employer] Interactive Patient Education  2017 [employer]  

## 2017-01-01 ENCOUNTER — Other Ambulatory Visit: Payer: Self-pay | Admitting: *Deleted

## 2017-01-01 MED ORDER — PRAVASTATIN SODIUM 20 MG PO TABS: ORAL_TABLET | ORAL | 0 refills | Status: DC

## 2017-01-22 ENCOUNTER — Ambulatory Visit (INDEPENDENT_AMBULATORY_CARE_PROVIDER_SITE_OTHER): Payer: Medicare Other | Admitting: Family Medicine

## 2017-01-22 ENCOUNTER — Encounter: Payer: Self-pay | Admitting: Family Medicine

## 2017-01-22 VITALS — BP 112/72 | Ht 70.0 in | Wt 194.2 lb

## 2017-01-22 DIAGNOSIS — E782 Mixed hyperlipidemia: Secondary | ICD-10-CM

## 2017-01-22 DIAGNOSIS — Z1211 Encounter for screening for malignant neoplasm of colon: Secondary | ICD-10-CM | POA: Diagnosis not present

## 2017-01-22 DIAGNOSIS — Z23 Encounter for immunization: Secondary | ICD-10-CM | POA: Diagnosis not present

## 2017-01-22 DIAGNOSIS — Z0000 Encounter for general adult medical examination without abnormal findings: Secondary | ICD-10-CM | POA: Diagnosis not present

## 2017-01-22 DIAGNOSIS — Z125 Encounter for screening for malignant neoplasm of prostate: Secondary | ICD-10-CM | POA: Diagnosis not present

## 2017-01-22 DIAGNOSIS — I10 Essential (primary) hypertension: Secondary | ICD-10-CM

## 2017-01-22 DIAGNOSIS — E781 Pure hyperglyceridemia: Secondary | ICD-10-CM

## 2017-01-22 MED ORDER — PRAVASTATIN SODIUM 20 MG PO TABS: ORAL_TABLET | ORAL | 0 refills | Status: DC

## 2017-01-22 MED ORDER — AMLODIPINE BESYLATE 5 MG PO TABS: 5.0000 mg | ORAL_TABLET | Freq: Every day | ORAL | 6 refills | Status: DC

## 2017-01-22 NOTE — Progress Notes (Signed)
Subjective:    Patient ID: Stanley Reyes, male    DOB: 05-02-1950, 66 y.o.   MRN: [id number]  HPI  AWV- Annual Wellness Visit  The patient was seen for their annual wellness visit. The patient's past medical history, surgical history, and family history were reviewed. Pertinent vaccines were reviewed ( tetanus, pneumonia, shingles, flu) The patient's medication list was reviewed and updated.  The height and weight were entered. The patient's current BMI is:27.8  Cognitive screening was completed. Outcome of Mini - Cog: pass  Falls within the past 6 months:pass  Current tobacco usage: none (All patients who use tobacco were given written and verbal information on quitting)  Recent listing of emergency department/hospitalizations over the past year were reviewed.  current specialist the patient sees on a regular basis: none   Medicare annual wellness visit patient questionnaire was reviewed.  A written screening schedule for the patient for the next 5-10 years was given. Appropriate discussion of followup regarding next visit was discussed.      Review of Systems  Constitutional: Negative for activity change, appetite change and fever.  HENT: Negative for congestion and rhinorrhea.   Eyes: Negative for discharge.  Respiratory: Negative for cough and wheezing.   Cardiovascular: Negative for chest pain.  Gastrointestinal: Negative for abdominal pain, blood in stool and vomiting.  Genitourinary: Negative for difficulty urinating and frequency.  Musculoskeletal: Negative for neck pain.  Skin: Negative for rash.  Allergic/Immunologic: Negative for environmental allergies and food allergies.  Neurological: Negative for weakness and headaches.  Psychiatric/Behavioral: Negative for agitation.  some sinus congestion       Objective:   Physical Exam  Constitutional: He appears well-developed and well-nourished.  HENT:  Head: Normocephalic and atraumatic.  Right  Ear: External ear normal.  Left Ear: External ear normal.  Nose: Nose normal.  Mouth/Throat: Oropharynx is clear and moist.  Eyes: Pupils are equal, round, and reactive to light. EOM are normal.  Neck: Normal range of motion. Neck supple. No thyromegaly present.  Cardiovascular: Normal rate, regular rhythm and normal heart sounds.   No murmur heard. Pulmonary/Chest: Effort normal and breath sounds normal. No respiratory distress. He has no wheezes.  Abdominal: Soft. Bowel sounds are normal. He exhibits no distension and no mass. There is no tenderness.  Genitourinary: Penis normal.  Musculoskeletal: Normal range of motion. He exhibits no edema.  Lymphadenopathy:    He has no cervical adenopathy.  Neurological: He is alert. He exhibits normal muscle tone.  Skin: Skin is warm and dry. No erythema.  Psychiatric: He has a normal mood and affect. His behavior is normal. Judgment normal.    Patient takes blood pressure medicine regular basis watches how he eats tries to stay active does not smoke tolerates the aspirin Takes his cholesterol medicine does not cause side effects stays active watch his diet He is due for his colonoscopy Prostate exam normal.      Assessment & Plan:  Adult wellness-complete.wellness physical was conducted today. Importance of diet and exercise were discussed in detail. In addition to this a discussion regarding safety was also covered. We also reviewed over immunizations and gave recommendations regarding current immunization needed for age. In addition to this additional areas were also touched on including: Preventative health exams needed: Colonoscopy this is due referral was sent  Patient was advised yearly wellness exam  HTN- Patient was seen today as part of a visit regarding hypertension. The importance of healthy diet and regular physical activity was discussed.  The importance of compliance with medications discussed. Ideal goal is to keep blood  pressure low elevated levels certainly below 672/09 when possible. The patient was counseled that keeping blood pressure under control lessen his risk of heart attack, stroke, kidney failure, and early death. The importance of regular follow-ups was discussed with the patient. Low-salt diet such as DASH recommended. Regular physical activity was recommended as well. Patient was advised to keep regular follow-ups.  The patient was seen today as part of an evaluation regarding hyperlipidemia. Recent lab work has been reviewed with the patient as well as the goals for good cholesterol care. In addition to this medications have been discussed the importance of compliance with diet and medications discussed as well. Patient has been informed of potential side effects of medications in the importance to notify us should any problems occur. Finally the patient is aware that poor control of cholesterol, noncompliance can dramatically increase her risk of heart attack strokes and premature death. The patient will keep regular office visits and the patient does agreed to periodic lab work.

## 2017-01-23 ENCOUNTER — Encounter: Payer: Self-pay | Admitting: Family Medicine

## 2017-01-23 LAB — CBC WITH DIFFERENTIAL/PLATELET
BASOS ABS: 0 10*3/uL (ref 0.0–0.2)
Basos: 0 %
EOS (ABSOLUTE): 0.2 10*3/uL (ref 0.0–0.4)
Eos: 3 %
Hematocrit: 45.9 % (ref 37.5–51.0)
Hemoglobin: 15.6 g/dL (ref 13.0–17.7)
IMMATURE GRANS (ABS): 0 10*3/uL (ref 0.0–0.1)
IMMATURE GRANULOCYTES: 0 %
LYMPHS: 31 %
Lymphocytes Absolute: 2.1 10*3/uL (ref 0.7–3.1)
MCH: 32.3 pg (ref 26.6–33.0)
MCHC: 34 g/dL (ref 31.5–35.7)
MCV: 95 fL (ref 79–97)
MONOS ABS: 0.4 10*3/uL (ref 0.1–0.9)
Monocytes: 6 %
NEUTROS PCT: 60 %
Neutrophils Absolute: 4 10*3/uL (ref 1.4–7.0)
PLATELETS: 184 10*3/uL (ref 150–379)
RBC: 4.83 x10E6/uL (ref 4.14–5.80)
RDW: 14.1 % (ref 12.3–15.4)
WBC: 6.8 10*3/uL (ref 3.4–10.8)

## 2017-01-23 LAB — BASIC METABOLIC PANEL
BUN/Creatinine Ratio: 12 (ref 10–24)
BUN: 15 mg/dL (ref 8–27)
CALCIUM: 9.9 mg/dL (ref 8.6–10.2)
CHLORIDE: 105 mmol/L (ref 96–106)
CO2: 24 mmol/L (ref 20–29)
CREATININE: 1.24 mg/dL (ref 0.76–1.27)
GFR calc Af Amer: 69 mL/min/{1.73_m2} (ref >59)
GFR calc non Af Amer: 59 mL/min/{1.73_m2} — ABNORMAL LOW (ref >59)
GLUCOSE: 125 mg/dL — AB (ref 65–99)
Potassium: 4.7 mmol/L (ref 3.5–5.2)
Sodium: 144 mmol/L (ref 134–144)

## 2017-01-23 LAB — LIPID PANEL
CHOL/HDL RATIO: 4 ratio (ref 0.0–5.0)
Cholesterol, Total: 159 mg/dL (ref 100–199)
HDL: 40 mg/dL (ref >39)
LDL Calculated: 84 mg/dL (ref 0–99)
Triglycerides: 173 mg/dL — ABNORMAL HIGH (ref 0–149)
VLDL CHOLESTEROL CAL: 35 mg/dL (ref 5–40)

## 2017-01-23 LAB — PSA: PROSTATE SPECIFIC AG, SERUM: 0.5 ng/mL (ref 0.0–4.0)

## 2017-01-23 LAB — HEMOGLOBIN A1C
ESTIMATED AVERAGE GLUCOSE: 123 mg/dL
Hgb A1c MFr Bld: 5.9 % — ABNORMAL HIGH (ref 4.8–5.6)

## 2017-01-23 LAB — HEPATIC FUNCTION PANEL
ALBUMIN: 4.9 g/dL — AB (ref 3.6–4.8)
ALK PHOS: 93 IU/L (ref 39–117)
ALT: 27 IU/L (ref 0–44)
AST: 24 IU/L (ref 0–40)
Bilirubin Total: 0.7 mg/dL (ref 0.0–1.2)
Bilirubin, Direct: 0.17 mg/dL (ref 0.00–0.40)
TOTAL PROTEIN: 7.3 g/dL (ref 6.0–8.5)

## 2017-01-25 ENCOUNTER — Encounter: Payer: Self-pay | Admitting: Family Medicine

## 2017-01-27 ENCOUNTER — Encounter (INDEPENDENT_AMBULATORY_CARE_PROVIDER_SITE_OTHER): Payer: Self-pay | Admitting: *Deleted

## 2017-02-13 ENCOUNTER — Other Ambulatory Visit (INDEPENDENT_AMBULATORY_CARE_PROVIDER_SITE_OTHER): Payer: Self-pay | Admitting: *Deleted

## 2017-02-13 DIAGNOSIS — Z86010 Personal history of colonic polyps: Secondary | ICD-10-CM

## 2017-03-26 ENCOUNTER — Telehealth (INDEPENDENT_AMBULATORY_CARE_PROVIDER_SITE_OTHER): Payer: Self-pay | Admitting: *Deleted

## 2017-03-26 ENCOUNTER — Encounter (INDEPENDENT_AMBULATORY_CARE_PROVIDER_SITE_OTHER): Payer: Self-pay | Admitting: *Deleted

## 2017-03-26 MED ORDER — PEG 3350-KCL-NA BICARB-NACL 420 G PO SOLR: 4000.0000 mL | Freq: Once | ORAL | 0 refills | Status: AC

## 2017-03-26 NOTE — Telephone Encounter (Signed)
Patient needs trilyte 

## 2017-04-16 ENCOUNTER — Telehealth (INDEPENDENT_AMBULATORY_CARE_PROVIDER_SITE_OTHER): Payer: Self-pay | Admitting: *Deleted

## 2017-04-16 NOTE — Telephone Encounter (Signed)
Referring MD/PCP: scott luking   Procedure: tcs  Reason/Indication:  Hx polyps  Has patient had this procedure before?  Yes, 2013  If so, when, by whom and where?    Is there a family history of colon cancer?  no  Who?  What age when diagnosed?    Is patient diabetic?   no      Does patient have prosthetic heart valve or mechanical valve?  no  Do you have a pacemaker?  no  Has patient ever had endocarditis? no  Has patient had joint replacement within last 12 months?  no  Is patient constipated or take laxatives? no  Does patient have a history of alcohol/drug use?  no  Is patient on Coumadin, Plavix and/or Aspirin? yes  Medications: asa 81 mg daily, pravastatin 20 mg daily, amlodipine 5 mg daily  Allergies: nkda  Medication Adjustment per Dr Laural Golden: asa 2 days  Procedure date & time: 05/14/17 at 930

## 2017-04-16 NOTE — Telephone Encounter (Signed)
agree

## 2017-04-27 ENCOUNTER — Other Ambulatory Visit: Payer: Self-pay | Admitting: Family Medicine

## 2017-05-13 ENCOUNTER — Encounter (HOSPITAL_COMMUNITY): Payer: Self-pay | Admitting: *Deleted

## 2017-05-14 ENCOUNTER — Encounter (HOSPITAL_COMMUNITY): Payer: Self-pay | Admitting: *Deleted

## 2017-05-14 ENCOUNTER — Ambulatory Visit (HOSPITAL_COMMUNITY)
Admission: RE | Admit: 2017-05-14 | Discharge: 2017-05-14 | Disposition: A | Discharge status: Home / Self Care | Payer: Medicare Other | Source: Ambulatory Visit | Attending: Internal Medicine | Admitting: Internal Medicine

## 2017-05-14 ENCOUNTER — Other Ambulatory Visit: Payer: Self-pay

## 2017-05-14 ENCOUNTER — Encounter (HOSPITAL_COMMUNITY)
Admission: RE | Disposition: A | Discharge status: Home / Self Care | Payer: Self-pay | Source: Ambulatory Visit | Attending: Internal Medicine

## 2017-05-14 DIAGNOSIS — Z7982 Long term (current) use of aspirin: Secondary | ICD-10-CM | POA: Insufficient documentation

## 2017-05-14 DIAGNOSIS — K5730 Diverticulosis of large intestine without perforation or abscess without bleeding: Secondary | ICD-10-CM | POA: Insufficient documentation

## 2017-05-14 DIAGNOSIS — Z1211 Encounter for screening for malignant neoplasm of colon: Secondary | ICD-10-CM | POA: Diagnosis not present

## 2017-05-14 DIAGNOSIS — Z8249 Family history of ischemic heart disease and other diseases of the circulatory system: Secondary | ICD-10-CM | POA: Insufficient documentation

## 2017-05-14 DIAGNOSIS — Z86010 Personal history of colonic polyps: Secondary | ICD-10-CM | POA: Insufficient documentation

## 2017-05-14 DIAGNOSIS — I10 Essential (primary) hypertension: Secondary | ICD-10-CM | POA: Insufficient documentation

## 2017-05-14 DIAGNOSIS — E7800 Pure hypercholesterolemia, unspecified: Secondary | ICD-10-CM | POA: Diagnosis not present

## 2017-05-14 DIAGNOSIS — D123 Benign neoplasm of transverse colon: Secondary | ICD-10-CM | POA: Insufficient documentation

## 2017-05-14 DIAGNOSIS — K644 Residual hemorrhoidal skin tags: Secondary | ICD-10-CM | POA: Insufficient documentation

## 2017-05-14 DIAGNOSIS — Z79899 Other long term (current) drug therapy: Secondary | ICD-10-CM | POA: Insufficient documentation

## 2017-05-14 DIAGNOSIS — Z09 Encounter for follow-up examination after completed treatment for conditions other than malignant neoplasm: Secondary | ICD-10-CM | POA: Diagnosis not present

## 2017-05-14 HISTORY — PX: COLONOSCOPY: SHX5424

## 2017-05-14 HISTORY — DX: Polyp of colon: K63.5

## 2017-05-14 SURGERY — COLONOSCOPY
Anesthesia: Moderate Sedation

## 2017-05-14 MED ORDER — MEPERIDINE HCL 50 MG/ML IJ SOLN
INTRAMUSCULAR | Status: AC
  Filled 2017-05-14: qty 1

## 2017-05-14 MED ORDER — SIMETHICONE 40 MG/0.6ML PO SUSP
ORAL | Status: DC | PRN
  Administered 2017-05-14: 15 mL

## 2017-05-14 MED ORDER — MEPERIDINE HCL 50 MG/ML IJ SOLN
INTRAMUSCULAR | Status: DC | PRN
  Administered 2017-05-14 (×2): 25 mg via INTRAVENOUS

## 2017-05-14 MED ORDER — MIDAZOLAM HCL 5 MG/5ML IJ SOLN
INTRAMUSCULAR | Status: DC | PRN
  Administered 2017-05-14 (×4): 2 mg via INTRAVENOUS

## 2017-05-14 MED ORDER — MIDAZOLAM HCL 5 MG/5ML IJ SOLN
INTRAMUSCULAR | Status: AC
  Filled 2017-05-14: qty 10

## 2017-05-14 MED ORDER — SODIUM CHLORIDE 0.9 % IV SOLN
INTRAVENOUS | Status: DC
  Administered 2017-05-14: 09:00:00 via INTRAVENOUS

## 2017-05-14 NOTE — Discharge Instructions (Signed)
No aspirin or NSAIDs for 1 week. Resume other medications as before. High fiber diet. No driving for 24 hours. Physician will call with biopsy results.   Colonoscopy, Adult, Care After This sheet gives you information about how to care for yourself after your procedure. Your health care provider may also give you more specific instructions. If you have problems or questions, contact your health care provider. What can I expect after the procedure? After the procedure, it is common to have:  A small amount of blood in your stool for 24 hours after the procedure.  Some gas.  Mild abdominal cramping or bloating.  Follow these instructions at home: General instructions   For the first 24 hours after the procedure: ? Do not drive or use machinery. ? Do not sign important documents. ? Do not drink alcohol. ? Do your regular daily activities at a slower pace than normal. ? Eat soft, easy-to-digest foods. ? Rest often.  Take over-the-counter or prescription medicines only as told by your health care provider.  It is up to you to get the results of your procedure. Ask your health care provider, or the department performing the procedure, when your results will be ready. Relieving cramping and bloating  Try walking around when you have cramps or feel bloated.  Apply heat to your abdomen as told by your health care provider. Use a heat source that your health care provider recommends, such as a moist heat pack or a heating pad. ? Place a towel between your skin and the heat source. ? Leave the heat on for 20-30 minutes. ? Remove the heat if your skin turns bright red. This is especially important if you are unable to feel pain, heat, or cold. You may have a greater risk of getting burned. Eating and drinking  Drink enough fluid to keep your urine clear or pale yellow.  Resume your normal diet as instructed by your health care provider. Avoid heavy or fried foods that are hard to  digest.  Avoid drinking alcohol for as long as instructed by your health care provider. Contact a health care provider if:  You have blood in your stool 2-3 days after the procedure. Get help right away if:  You have more than a small spotting of blood in your stool.  You pass large blood clots in your stool.  Your abdomen is swollen.  You have nausea or vomiting.  You have a fever.  You have increasing abdominal pain that is not relieved with medicine. This information is not intended to replace advice given to you by your health care provider. Make sure you discuss any questions you have with your health care provider. Document Released: 11/20/2003 Document Revised: 12/31/2015 Document Reviewed: 06/19/2015 Elsevier Interactive Patient Education  2018 [employer].   High-Fiber Diet Fiber, also called dietary fiber, is a type of carbohydrate found in fruits, vegetables, whole grains, and beans. A high-fiber diet can have many health benefits. Your health care provider may recommend a high-fiber diet to help:  Prevent constipation. Fiber can make your bowel movements more regular.  Lower your cholesterol.  Relieve hemorrhoids, uncomplicated diverticulosis, or irritable bowel syndrome.  Prevent overeating as part of a weight-loss plan.  Prevent heart disease, type 2 diabetes, and certain cancers.  What is my plan? The recommended daily intake of fiber includes:  38 grams for men under age 51.  37 grams for men over age 69.  61 grams for women under age 50.  63  grams for women over age 61.  You can get the recommended daily intake of dietary fiber by eating a variety of fruits, vegetables, grains, and beans. Your health care provider may also recommend a fiber supplement if it is not possible to get enough fiber through your diet. What do I need to know about a high-fiber diet?  Fiber supplements have not been widely studied for their effectiveness, so it is better  to get fiber through food sources.  Always check the fiber content on thenutrition facts label of any prepackaged food. Look for foods that contain at least 5 grams of fiber per serving.  Ask your dietitian if you have questions about specific foods that are related to your condition, especially if those foods are not listed in the following section.  Increase your daily fiber consumption gradually. Increasing your intake of dietary fiber too quickly may cause bloating, cramping, or gas.  Drink plenty of water. Water helps you to digest fiber. What foods can I eat? Grains Whole-grain breads. Multigrain cereal. Oats and oatmeal. Brown rice. Barley. Bulgur wheat. [place]. Bran muffins. Popcorn. Rye wafer crackers. Vegetables Sweet potatoes. Spinach. Kale. Artichokes. Cabbage. Broccoli. Green peas. Carrots. Squash. Fruits Berries. Pears. Apples. Oranges. Avocados. Prunes and raisins. Dried figs. Meats and Other Protein Sources Navy, kidney, pinto, and soy beans. Split peas. Lentils. Nuts and seeds. Dairy Fiber-fortified yogurt. Beverages Fiber-fortified soy milk. Fiber-fortified orange juice. Other Fiber bars. The items listed above may not be a complete list of recommended foods or beverages. Contact your dietitian for more options. What foods are not recommended? Grains White bread. Pasta made with refined flour. White rice. Vegetables Fried potatoes. Canned vegetables. Well-cooked vegetables. Fruits Fruit juice. Cooked, strained fruit. Meats and Other Protein Sources Fatty cuts of meat. Fried Sales executive or fried fish. Dairy Milk. Yogurt. Cream cheese. Sour cream. Beverages Soft drinks. Other Cakes and pastries. Butter and oils. The items listed above may not be a complete list of foods and beverages to avoid. Contact your dietitian for more information. What are some tips for including high-fiber foods in my diet?  Eat a wide variety of high-fiber foods.  Make sure that half  of all grains consumed each day are whole grains.  Replace breads and cereals made from refined flour or white flour with whole-grain breads and cereals.  Replace white rice with brown rice, bulgur wheat, or millet.  Start the day with a breakfast that is high in fiber, such as a cereal that contains at least 5 grams of fiber per serving.  Use beans in place of meat in soups, salads, or pasta.  Eat high-fiber snacks, such as berries, raw vegetables, nuts, or popcorn. This information is not intended to replace advice given to you by your health care provider. Make sure you discuss any questions you have with your health care provider. Document Released: 04/07/2005 Document Revised: 09/13/2015 Document Reviewed: 09/20/2013 Elsevier Interactive Patient Education  [employer].

## 2017-05-14 NOTE — Op Note (Signed)
[facility] Patient Name: Stanley Reyes Procedure Date: 05/14/2017 9:04 AM MRN: [id number] Date of Birth: 1951/03/28 Attending MD: Hildred Laser , MD CSN: [id number] Age: 67 Admit Type: Outpatient Procedure:                Colonoscopy Indications:              High risk colon cancer surveillance: Personal                            history of colonic polyps Providers:                Hildred Laser, MD, Charlsie Quest. Theda Sers RN, RN, Nelma Rothman, Technician Referring MD:             Elayne Snare. Wolfgang Phoenix, MD Medicines:                Meperidine 50 mg IV, Midazolam 8 mg IV Complications:            No immediate complications. Estimated Blood Loss:     Estimated blood loss: none. Procedure:                Pre-Anesthesia Assessment:                           - Prior to the procedure, a History and Physical                            was performed, and patient medications and                            allergies were reviewed. The patient's tolerance of                            previous anesthesia was also reviewed. The risks                            and benefits of the procedure and the sedation                            options and risks were discussed with the patient.                            All questions were answered, and informed consent                            was obtained. Prior Anticoagulants: The patient                            last took aspirin 3 days prior to the procedure.                            ASA Grade Assessment: II - A patient with mild  systemic disease. After reviewing the risks and                            benefits, the patient was deemed in satisfactory                            condition to undergo the procedure.                           After obtaining informed consent, the colonoscope                            was passed under direct vision. Throughout the                            procedure,  the patient's blood pressure, pulse, and                            oxygen saturations were monitored continuously. The                            [id number] ([id number]) was introduced through the                            anus and advanced to the the cecum, identified by                            appendiceal orifice and ileocecal valve. The                            colonoscopy was performed without difficulty. The                            patient tolerated the procedure well. The quality                            of the bowel preparation was adequate to identify                            polyps. The ileocecal valve, appendiceal orifice,                            and rectum were photographed. Scope In: 9:23:24 AM Scope Out: 9:45:18 AM Scope Withdrawal Time: 0 hours 16 minutes 57 seconds  Total Procedure Duration: 0 hours 21 minutes 54 seconds  Findings:      The perianal and digital rectal examinations were normal.      Scattered small and large-mouthed diverticula were found in the entire       colon.      A 8 mm polyp was found in the proximal transverse colon. The polyp was       semi-pedunculated. The polyp was removed with a hot snare. Resection and       retrieval were complete.      External hemorrhoids were found during retroflexion. The hemorrhoids  were small. Impression:               - Diverticulosis in the entire examined colon.                           - One 8 mm polyp in the proximal transverse colon,                            removed with a hot snare. Resected and retrieved.                           - External hemorrhoids. Moderate Sedation:      Moderate (conscious) sedation was administered by the endoscopy nurse       and supervised by the endoscopist. The following parameters were       monitored: oxygen saturation, heart rate, blood pressure, CO2       capnography and response to care. Total physician intraservice time was       28  minutes. Recommendation:           - Patient has a contact number available for                            emergencies. The signs and symptoms of potential                            delayed complications were discussed with the                            patient. Return to normal activities tomorrow.                            Written discharge instructions were provided to the                            patient.                           - High fiber diet today.                           - Continue present medications.                           - No aspirin, ibuprofen, naproxen, or other                            non-steroidal anti-inflammatory drugs for 7 days                            after polyp removal.                           - Await pathology results.                           - Repeat colonoscopy for surveillance. Procedure Code(s):        ---  Professional ---                           [phone], Colonoscopy, flexible; with removal of                            tumor(s), polyp(s), or other lesion(s) by snare                            technique                           99152, Moderate sedation services provided by the                            same physician or other qualified health care                            professional performing the diagnostic or                            therapeutic service that the sedation supports,                            requiring the presence of an independent trained                            observer to assist in the monitoring of the                            patient's level of consciousness and physiological                            status; initial 15 minutes of intraservice time,                            patient age 25 years or older                           [phone], Moderate sedation services; each additional                            15 minutes intraservice time Diagnosis Code(s):        --- Professional ---                            Z86.010, Personal history of colonic polyps                           K64.4, Residual hemorrhoidal skin tags                           D12.3, Benign neoplasm of transverse colon (hepatic                            flexure or splenic flexure)  K57.30, Diverticulosis of large intestine without                            perforation or abscess without bleeding CPT copyright 2016 American Medical Association. All rights reserved. The codes documented in this report are preliminary and upon coder review may  be revised to meet current compliance requirements. Hildred Laser, MD Hildred Laser, MD 05/14/2017 9:54:01 AM This report has been signed electronically. Number of Addenda: 0

## 2017-05-14 NOTE — H&P (Signed)
Stanley Reyes is an 67 y.o. male.   Chief Complaint: Patient is here for colonoscopy. HPI: Patient 67 year old Caucasian male who has a history of colonic adenomas and is here for surveillance colonoscopy.  He denies abdominal pain change in bowel habits or rectal bleeding. Last colonoscopy was in 2013 with removal of 3 polyps and these are tubular adenomas.  Fourth polyp was coagulated. Family history is negative for CRC.  Past Medical History:  Diagnosis Date  . Arthritis   . Colon polyps   . Hemorrhoids   . Hypercholesteremia   . Hypertension     Past Surgical History:  Procedure Laterality Date  . CATARACT EXTRACTION W/PHACO  08/04/2011   Procedure: CATARACT EXTRACTION PHACO AND INTRAOCULAR LENS PLACEMENT (IOC);  Surgeon: Williams Che, MD;  Location: [facility];  Service: Ophthalmology;  Laterality: Right;  CDE:  5.60  . CATARACT EXTRACTION W/PHACO Left 05/28/2015   Procedure: CATARACT EXTRACTION PHACO AND INTRAOCULAR LENS PLACEMENT (IOC);  Surgeon: Williams Che, MD;  Location: [facility];  Service: Ophthalmology;  Laterality: Left;  CDE 8.13  . COLONOSCOPY  03/05/2012   Procedure: COLONOSCOPY;  Surgeon: Rogene Houston, MD;  Location: [facility] SUITE;  Service: Endoscopy;  Laterality: N/A;  1225-changed to 1200 Ann to notify pt  . REPAIR BIFID DIGIT  2000   repair of 3rd and 4th digit.  Fingers  . ROTATOR CUFF REPAIR Left 2007    Family History  Problem Relation Age of Onset  . Heart attack Sister   . Hyperlipidemia Sister   . Heart attack Brother   . Colon cancer Neg Hx    Social History:  reports that  has never smoked. he has never used smokeless tobacco. He reports that he does not drink alcohol or use drugs.  Allergies: No Known Allergies  Medications Prior to Admission  Medication Sig Dispense Refill  . amLODipine (NORVASC) 5 MG tablet Take 1 tablet (5 mg total) by mouth daily. 30 tablet 6  . aspirin 81 MG tablet Take 81 mg by mouth daily.    . fexofenadine  (ALLEGRA) 180 MG tablet Take 180 mg by mouth daily.    . fish oil-omega-3 fatty acids 1000 MG capsule Take 2 g by mouth every morning.    Marland Kitchen ibuprofen (ADVIL,MOTRIN) 200 MG tablet Take 600 mg by mouth every 6 (six) hours as needed for headache or moderate pain.    . Multiple Vitamin (MULITIVITAMIN WITH MINERALS) TABS Take 1 tablet by mouth every morning.    . pravastatin (PRAVACHOL) 20 MG tablet TAKE ONE TABLET BY MOUTH EVERY DAY (Patient taking differently: Take 20 mg by mouth daily. ) 90 tablet 1  . fluticasone (FLONASE) 50 MCG/ACT nasal spray Place 2 sprays into both nostrils daily. (Patient not taking: Reported on 05/07/2017) 16 g 6  . meclizine (ANTIVERT) 25 MG tablet Take 1 tablet (25 mg total) by mouth 3 (three) times daily as needed for dizziness or nausea. (Patient not taking: Reported on 05/07/2017) 30 tablet 1  . Tetrahydrozoline HCl (VISINE OP) Place 1 drop into both eyes daily as needed (for dry eyes).    . triamcinolone cream (KENALOG) 0.1 % Apply 1 application topically 2 (two) times daily as needed. (Patient not taking: Reported on 05/07/2017) 45 g 4    No results found for this or any previous visit (from the past 48 hour(s)). No results found.  ROS  Blood pressure (!) 146/91, pulse 79, temperature 97.7 F (36.5 C), temperature source Oral, resp.  rate 17, height 5\' 10"  (1.778 m), weight 193 lb (87.5 kg), SpO2 97 %. Physical Exam  Constitutional: He appears well-developed and well-nourished.  HENT:  Mouth/Throat: Oropharynx is clear and moist.  Eyes: Conjunctivae are normal. No scleral icterus.  Neck: No thyromegaly present.  Cardiovascular: Normal rate, regular rhythm and normal heart sounds.  No murmur heard. Respiratory: Effort normal and breath sounds normal.  GI: Soft. He exhibits no distension and no mass. There is no tenderness.  Musculoskeletal: He exhibits no edema.  Lymphadenopathy:    He has no cervical adenopathy.  Neurological: He is alert.  Skin: Skin is warm  and dry.     Assessment/Plan History of colonic adenomas. Surveillance colonoscopy.  Hildred Laser, MD 05/14/2017, 9:13 AM

## 2017-05-18 ENCOUNTER — Encounter (HOSPITAL_COMMUNITY): Payer: Self-pay | Admitting: Internal Medicine

## 2017-08-21 ENCOUNTER — Telehealth: Payer: Self-pay | Admitting: *Deleted

## 2017-08-21 ENCOUNTER — Emergency Department (HOSPITAL_COMMUNITY): Payer: Medicare Other

## 2017-08-21 ENCOUNTER — Other Ambulatory Visit: Payer: Self-pay

## 2017-08-21 ENCOUNTER — Encounter (HOSPITAL_COMMUNITY): Payer: Self-pay | Admitting: Emergency Medicine

## 2017-08-21 ENCOUNTER — Emergency Department (HOSPITAL_COMMUNITY)
Admission: EM | Admit: 2017-08-21 | Discharge: 2017-08-21 | Disposition: A | Discharge status: Home / Self Care | Payer: Medicare Other | Attending: Emergency Medicine | Admitting: Emergency Medicine

## 2017-08-21 DIAGNOSIS — R0789 Other chest pain: Secondary | ICD-10-CM | POA: Diagnosis not present

## 2017-08-21 DIAGNOSIS — Z7982 Long term (current) use of aspirin: Secondary | ICD-10-CM | POA: Insufficient documentation

## 2017-08-21 DIAGNOSIS — I10 Essential (primary) hypertension: Secondary | ICD-10-CM | POA: Diagnosis not present

## 2017-08-21 DIAGNOSIS — R079 Chest pain, unspecified: Secondary | ICD-10-CM | POA: Diagnosis not present

## 2017-08-21 DIAGNOSIS — Z79899 Other long term (current) drug therapy: Secondary | ICD-10-CM | POA: Diagnosis not present

## 2017-08-21 LAB — BASIC METABOLIC PANEL
Anion gap: 10 (ref 5–15)
BUN: 21 mg/dL — ABNORMAL HIGH (ref 6–20)
CHLORIDE: 105 mmol/L (ref 101–111)
CO2: 21 mmol/L — AB (ref 22–32)
CREATININE: 1.1 mg/dL (ref 0.61–1.24)
Calcium: 9.4 mg/dL (ref 8.9–10.3)
GFR calc non Af Amer: >60 mL/min (ref >60)
GLUCOSE: 172 mg/dL — AB (ref 65–99)
Potassium: 3.4 mmol/L — ABNORMAL LOW (ref 3.5–5.1)
Sodium: 136 mmol/L (ref 135–145)

## 2017-08-21 LAB — CBC WITH DIFFERENTIAL/PLATELET
BASOS ABS: 0 10*3/uL (ref 0.0–0.1)
Basophils Relative: 0 %
Eosinophils Absolute: 0.2 10*3/uL (ref 0.0–0.7)
Eosinophils Relative: 2 %
HCT: 41.3 % (ref 39.0–52.0)
HEMOGLOBIN: 14.2 g/dL (ref 13.0–17.0)
Lymphocytes Relative: 26 %
Lymphs Abs: 2 10*3/uL (ref 0.7–4.0)
MCH: 32.1 pg (ref 26.0–34.0)
MCHC: 34.4 g/dL (ref 30.0–36.0)
MCV: 93.2 fL (ref 78.0–100.0)
MONO ABS: 0.5 10*3/uL (ref 0.1–1.0)
Monocytes Relative: 6 %
NEUTROS ABS: 4.8 10*3/uL (ref 1.7–7.7)
NEUTROS PCT: 64 %
Platelets: 170 10*3/uL (ref 150–400)
RBC: 4.43 MIL/uL (ref 4.22–5.81)
RDW: 13 % (ref 11.5–15.5)
WBC: 7.5 10*3/uL (ref 4.0–10.5)

## 2017-08-21 LAB — HEPATIC FUNCTION PANEL
ALBUMIN: 4.3 g/dL (ref 3.5–5.0)
ALT: 29 U/L (ref 17–63)
AST: 31 U/L (ref 15–41)
Alkaline Phosphatase: 79 U/L (ref 38–126)
BILIRUBIN TOTAL: 0.9 mg/dL (ref 0.3–1.2)
Bilirubin, Direct: 0.1 mg/dL (ref 0.1–0.5)
Indirect Bilirubin: 0.8 mg/dL (ref 0.3–0.9)
Total Protein: 6.7 g/dL (ref 6.5–8.1)

## 2017-08-21 LAB — TROPONIN I
Troponin I: <0.03 ng/mL (ref <0.03)
Troponin I: <0.03 ng/mL (ref <0.03)

## 2017-08-21 NOTE — ED Provider Notes (Signed)
[facility] EMERGENCY DEPARTMENT Provider Note   CSN: [id number] Arrival date & time: 08/21/17  1512     History   Chief Complaint Chief Complaint  Patient presents with  . Chest Pain    HPI Stanley Reyes is a 67 y.o. male.  Patient states he was doing a lot of shoveling using his left arm and was having some pain in his arm in his chest.  Patient not having pain now.  The history is provided by the patient. No language interpreter was used.  Chest Pain   This is a new problem. The current episode started 12 to 24 hours ago. The problem has been resolved. The pain is associated with exertion. The pain is present in the lateral region. The pain is at a severity of 5/10. The quality of the pain is described as dull. The pain does not radiate. Pertinent negatives include no abdominal pain, no back pain, no cough and no headaches.  Pertinent negatives for past medical history include no seizures.    Past Medical History:  Diagnosis Date  . Arthritis   . Colon polyps   . Hemorrhoids   . Hypercholesteremia   . Hypertension     Patient Active Problem List   Diagnosis Date Noted  . History of colonic polyps 02/13/2017  . Dizziness 07/23/2015  . Hypertriglyceridemia 12/27/2013  . Arthritis of knee, right 07/23/2013  . Hyperlipidemia 12/15/2012  . Essential hypertension, benign 12/15/2012  . Prediabetes 12/15/2012  . Renal insufficiency 12/15/2012    Past Surgical History:  Procedure Laterality Date  . CATARACT EXTRACTION W/PHACO  08/04/2011   Procedure: CATARACT EXTRACTION PHACO AND INTRAOCULAR LENS PLACEMENT (IOC);  Surgeon: Williams Che, MD;  Location: [facility];  Service: Ophthalmology;  Laterality: Right;  CDE:  5.60  . CATARACT EXTRACTION W/PHACO Left 05/28/2015   Procedure: CATARACT EXTRACTION PHACO AND INTRAOCULAR LENS PLACEMENT (IOC);  Surgeon: Williams Che, MD;  Location: [facility];  Service: Ophthalmology;  Laterality: Left;  CDE 8.13  . COLONOSCOPY   03/05/2012   Procedure: COLONOSCOPY;  Surgeon: Rogene Houston, MD;  Location: [facility] SU[facility];  Service: Endoscopy;  Laterality: N/A;  1225-changed to 1200 Ann to notify pt  . COLONOSCOPY N/A 05/14/2017   Procedure: COLONOSCOPY;  Surgeon: Rogene Houston, MD;  Location: [facility] SU[facility];  Service: Endoscopy;  Laterality: N/A;  930  . REPAIR BIFID DIGIT  2000   repair of 3rd and 4th digit.  Fingers  . ROTATOR CUFF REPAIR Left 2007        Home Medications    Prior to Admission medications   Medication Sig Start Date End Date Taking? Authorizing Provider  amLODipine (NORVASC) 5 MG tablet Take 1 tablet (5 mg total) by mouth daily. 01/22/17  Yes Kathyrn Drown, MD  aspirin 81 MG tablet Take 1 tablet (81 mg total) by mouth daily. 05/21/17  Yes Rehman, Mechele Dawley, MD  fexofenadine (ALLEGRA) 180 MG tablet Take 180 mg by mouth daily.   Yes [provider]  fish oil-omega-3 fatty acids 1000 MG capsule Take 2 g by mouth every morning.   Yes [provider]  ibuprofen (ADVIL,MOTRIN) 200 MG tablet Take 3 tablets (600 mg total) by mouth every 6 (six) hours as needed for headache or moderate pain. 05/21/17  Yes Rehman, Mechele Dawley, MD  Multiple Vitamin (MULITIVITAMIN WITH MINERALS) TABS Take 1 tablet by mouth every morning.   Yes [provider]  pravastatin (PRAVACHOL) 20 MG tablet TAKE ONE  TABLET BY MOUTH EVERY DAY Patient taking differently: Take 20 mg by mouth daily.  04/27/17  Yes Luking, Elayne Snare, MD  Tetrahydrozoline HCl (VISINE OP) Place 1 drop into both eyes daily as needed (for dry eyes).   Yes [provider]    Family History Family History  Problem Relation Age of Onset  . Heart attack Sister   . Hyperlipidemia Sister   . Heart attack Brother   . Colon cancer Neg Hx     Social History Social History   Tobacco Use  . Smoking status: Never Smoker  . Smokeless tobacco: Never Used  Substance Use Topics  . Alcohol use: No  . Drug use: No     Allergies     Patient has no known allergies.   Review of Systems Review of Systems  Constitutional: Negative for appetite change and fatigue.  HENT: Negative for congestion, ear discharge and sinus pressure.   Eyes: Negative for discharge.  Respiratory: Negative for cough.   Cardiovascular: Positive for chest pain.  Gastrointestinal: Negative for abdominal pain and diarrhea.  Genitourinary: Negative for frequency and hematuria.  Musculoskeletal: Negative for back pain.  Skin: Negative for rash.  Neurological: Negative for seizures and headaches.  Psychiatric/Behavioral: Negative for hallucinations.     Physical Exam Updated Vital Signs BP (!) 143/99 (BP Location: Right Arm)   Pulse 80   Temp 98.4 F (36.9 C) (Oral)   Resp (!) 21   Ht 5\' 10"  (1.778 m)   Wt 87.1 kg (192 lb)   SpO2 96%   BMI 27.55 kg/m   Physical Exam  Constitutional: He is oriented to person, place, and time. He appears well-developed.  HENT:  Head: Normocephalic.  Eyes: Conjunctivae and EOM are normal. No scleral icterus.  Neck: Neck supple. No thyromegaly present.  Cardiovascular: Normal rate and regular rhythm. Exam reveals no gallop and no friction rub.  No murmur heard. Pulmonary/Chest: No stridor. He has no wheezes. He has no rales. He exhibits tenderness.  Abdominal: He exhibits no distension. There is no tenderness. There is no rebound.  Musculoskeletal: Normal range of motion. He exhibits no edema.  Lymphadenopathy:    He has no cervical adenopathy.  Neurological: He is oriented to person, place, and time. He exhibits normal muscle tone. Coordination normal.  Skin: No rash noted. No erythema.  Psychiatric: He has a normal mood and affect. His behavior is normal.     ED Treatments / Results  Labs (all labs ordered are listed, but only abnormal results are displayed) Labs Reviewed  BASIC METABOLIC PANEL - Abnormal; Notable for the following components:      Result Value   Potassium 3.4 (*)    CO2  21 (*)    Glucose, Bld 172 (*)    BUN 21 (*)    All other components within normal limits  CBC WITH DIFFERENTIAL/PLATELET  TROPONIN I  HEPATIC FUNCTION PANEL  TROPONIN I    EKG EKG Interpretation  Date/Time:  Friday Aug 21 2017 15:17:28 EDT Ventricular Rate:  89 PR Interval:  190 QRS Duration: 98 QT Interval:  366 QTC Calculation: 445 R Axis:   -5 Text Interpretation:  Normal sinus rhythm Normal ECG Confirmed by Milton Ferguson [id number] on 08/21/2017 6:30:12 PM   Radiology Dg Chest 2 View  Result Date: 08/21/2017 CLINICAL DATA:  67 year old male with left side chest pain and pressure for 2 days. EXAM: CHEST - 2 VIEW COMPARISON:  None. FINDINGS: Normal lung volumes. Normal cardiac size and  mediastinal contours. Visualized tracheal air column is within normal limits. No pneumothorax, pulmonary edema, pleural effusion or confluent pulmonary opacity. Chronic healed right clavicle fracture. No acute osseous abnormality identified. Negative visible bowel gas pattern. IMPRESSION: Negative for age.  No acute cardiopulmonary abnormality. Electronically Signed   By: Genevie Ann M.D.   On: 08/21/2017 16:52    Procedures Procedures (including critical care time)  Medications Ordered in ED Medications - No data to display   Initial Impression / Assessment and Plan / ED Course  I have reviewed the triage vital signs and the nursing notes.  Pertinent labs & imaging results that were available during my care of the patient were reviewed by me and considered in my medical decision making (see chart for details).     Patient had EKG chest x-ray 2 troponins that were unremarkable.  Patient had no pain in the emergency department.  Doubt this pain is cardiac related.  Patient is already on an aspirin a day.  He will be referred to cardiology for possible outpatient work-up.  Patient was told to return if any problems  Final Clinical Impressions(s) / ED Diagnoses   Final diagnoses:  Chest wall pain     ED Discharge Orders    None       Milton Ferguson, MD 08/21/17 2117

## 2017-08-21 NOTE — ED Triage Notes (Signed)
Patient c/o left side chest pressure, non radiating that has been intermittent since Wednesday. Denies any shortness of breath, vomiting, or back pain. Patient does report some nausea and dizziness. Denies any cardiac hx.

## 2017-08-21 NOTE — Telephone Encounter (Signed)
yes

## 2017-08-21 NOTE — Telephone Encounter (Signed)
Patient called with c/o left sided chest tightness for a few days. Patient stated he thought the pain was from shoveling gravel but has not went away.  Consult with Dr Richardson Landry who advised patient to go to the ER for evaluation and treatment. Patient verbalized understanding.

## 2017-08-21 NOTE — Discharge Instructions (Addendum)
Call and make an appointment to see the cardiologist Dr. Bronson Ing or 1 of his partners here in [place].  Follow-up in the next 1 to 2 weeks.  Return sooner if problems

## 2017-09-09 NOTE — Progress Notes (Signed)
Cardiology Office Note   Date:  09/10/2017   ID:  Stanley Reyes, DOB June 08, 1950, MRN [id number]  PCP:  Kathyrn Drown, MD  Cardiologist:   Jenkins Rouge, MD   No chief complaint on file.     History of Present Illness: Stanley Reyes is a 67 y.o. male who presents for consultation regarding chest pain. Referred by Dr Wolfgang Phoenix Reviewed ER notes from 08/21/17.  Had been doing a lot of shoveling and developed 5/10 dull lateral left sided chest pain .Pain radiated to arm Resolved spontaneously after an hour or so In ER r/o ECG non acute CXR NAD  D/C home for outpatient evaluation. No previously documented CAD. 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 Arthritis   . Colon polyps   . Hemorrhoids   . Hypercholesteremia   . Hypertension     Past Surgical History:  Procedure Laterality Date  . CATARACT EXTRACTION W/PHACO  08/04/2011   Procedure: CATARACT EXTRACTION PHACO AND INTRAOCULAR LENS PLACEMENT (IOC);  Surgeon: Williams Che, MD;  Location: [facility];  Service: Ophthalmology;  Laterality: Right;  CDE:  5.60  . CATARACT EXTRACTION W/PHACO Left 05/28/2015   Procedure: CATARACT EXTRACTION PHACO AND INTRAOCULAR LENS PLACEMENT (IOC);  Surgeon: Williams Che, MD;  Location: [facility];  Service: Ophthalmology;  Laterality: Left;  CDE 8.13  . COLONOSCOPY  03/05/2012   Procedure: COLONOSCOPY;  Surgeon: Rogene Houston, MD;  Location: [facility];  Service: Endoscopy;  Laterality: N/A;  1225-changed to 1200 Ann to notify pt  . COLONOSCOPY N/A 05/14/2017   Procedure: COLONOSCOPY;  Surgeon: Rogene Houston, MD;  Location: [facility];  Service: Endoscopy;  Laterality: N/A;  930  . REPAIR  BIFID DIGIT  2000   repair of 3rd and 4th digit.  Fingers  . ROTATOR CUFF REPAIR Left 2007     Current Outpatient Medications  Medication Sig Dispense Refill  . amLODipine (NORVASC) 5 MG tablet Take 1 tablet (5 mg total) by mouth daily. 30 tablet 6  . aspirin 81 MG tablet Take 1 tablet (81 mg total) by mouth daily. 30 tablet   . fexofenadine (ALLEGRA) 180 MG tablet Take 180 mg by mouth daily.    . fish oil-omega-3 fatty acids 1000 MG capsule Take 2 g by mouth every morning.    Marland Kitchen ibuprofen (ADVIL,MOTRIN) 200 MG tablet Take 3 tablets (600 mg total) by mouth every 6 (six) hours as needed for headache or moderate pain. 30 tablet 0  . Multiple Vitamin (MULITIVITAMIN WITH MINERALS) TABS Take 1 tablet by mouth every morning.    . pravastatin (PRAVACHOL) 20 MG tablet TAKE ONE TABLET BY MOUTH EVERY DAY (Patient taking differently: Take 20 mg by mouth daily. ) 90 tablet 1  . Tetrahydrozoline HCl (VISINE OP) Place 1 drop into both eyes daily as needed (for dry eyes).     No current facility-administered medications for this visit.     Allergies:   Patient has no known allergies.    Social History:  The patient  reports that he has never smoked. He has never  used smokeless tobacco. He reports that he does not drink alcohol or use drugs.   Family History:  The patient's family history includes CAD in his sister; COPD in his sister; Cancer in his brother and brother; Heart attack in his brother and sister; Hyperlipidemia in his sister and sister.    ROS:  Please see the history of present illness.   Otherwise, review of systems are positive for none.   All other systems are reviewed and negative.    PHYSICAL EXAM: VS:  BP (!) 164/84   Pulse 61   Ht 5\' 9"  (1.753 m)   Wt 193 lb (87.5 kg)   SpO2 97%   BMI 28.50 kg/m  , BMI Body mass index is 28.5 kg/m. Affect appropriate Healthy:  appears stated age 63: normal Neck supple with no adenopathy JVP normal no bruits no thyromegaly Lungs  clear with no wheezing and good diaphragmatic motion Heart:  S1/S2 no murmur, no rub, gallop or click PMI normal Abdomen: benighn, BS positve, no tenderness, no AAA no bruit.  No HSM or HJR Distal pulses intact with no bruits No edema Neuro non-focal Skin warm and dry No muscular weakness    EKG:  08/24/17  SR rate 87 normal ECG    Recent Labs: 08/21/2017: ALT 29; BUN 21; Creatinine, Ser 1.10; Hemoglobin 14.2; Platelets 170; Potassium 3.4; Sodium 136    Lipid Panel    Component Value Date/Time   CHOL 159 01/22/2017 0925   TRIG 173 (H) 01/22/2017 0925   HDL 40 01/22/2017 0925   CHOLHDL 4.0 01/22/2017 0925   CHOLHDL 3.4 11/28/2013 0858   VLDL 24 11/28/2013 0858   LDLCALC 84 01/22/2017 0925      Wt Readings from Last 3 Encounters:  09/10/17 193 lb (87.5 kg)  08/21/17 192 lb (87.1 kg)  05/14/17 193 lb (87.5 kg)      Other studies Reviewed: Additional studies/ records that were reviewed today include: Notes from ER , labs ECG and CXR .    ASSESSMENT AND PLAN:  1.  Chest pain: atypical r/o ECG normal f/u ETT 2. HTN:  Told to take norvasc in am some white coat component f/u primary will see if he has HTN during stress test  3. HLD continue statin LDL at goal for patient with no documented vascular disease    Current medicines are reviewed at length with the patient today.  The patient does not have concerns regarding medicines.  The following changes have been made:  no change  Labs/ tests ordered today include: ETT myovue   Orders Placed This Encounter  Procedures  . NM Myocar Multi W/Spect W/Wall Motion / EF     Disposition:   FU with cardiology PRN      Signed, Jenkins Rouge, MD  09/10/2017 1:31 PM    [facility] [place], [place],   [address] Phone: [phone]; Fax: [phone]

## 2017-09-10 ENCOUNTER — Encounter: Payer: Self-pay | Admitting: Cardiovascular Disease

## 2017-09-10 ENCOUNTER — Ambulatory Visit: Payer: Medicare Other | Admitting: Cardiovascular Disease

## 2017-09-10 DIAGNOSIS — R079 Chest pain, unspecified: Secondary | ICD-10-CM | POA: Diagnosis not present

## 2017-09-10 NOTE — Patient Instructions (Signed)
Medication Instructions:  Your physician recommends that you continue on your current medications as directed. Please refer to the Current Medication list given to you today.   Labwork: none  Testing/Procedures: Your physician has requested that you have en exercise stress myoview. For further information please visit [email or web address]. Please follow instruction sheet, as given.    Follow-Up: Your physician recommends that you schedule a follow-up appointment in: as needed    Any Other Special Instructions Will Be Listed Below (If Applicable).     If you need a refill on your cardiac medications before your next appointment, please call your pharmacy.   

## 2017-09-17 ENCOUNTER — Encounter (HOSPITAL_COMMUNITY): Payer: Self-pay

## 2017-09-17 ENCOUNTER — Encounter (HOSPITAL_BASED_OUTPATIENT_CLINIC_OR_DEPARTMENT_OTHER)
Admission: RE | Admit: 2017-09-17 | Discharge: 2017-09-17 | Disposition: A | Discharge status: Home / Self Care | Payer: Medicare Other | Source: Ambulatory Visit | Attending: Cardiovascular Disease | Admitting: Cardiovascular Disease

## 2017-09-17 ENCOUNTER — Encounter (HOSPITAL_COMMUNITY)
Admission: RE | Admit: 2017-09-17 | Discharge: 2017-09-17 | Disposition: A | Discharge status: Home / Self Care | Payer: Medicare Other | Source: Ambulatory Visit | Attending: Cardiovascular Disease | Admitting: Cardiovascular Disease

## 2017-09-17 DIAGNOSIS — R079 Chest pain, unspecified: Secondary | ICD-10-CM

## 2017-09-17 LAB — NM MYOCAR MULTI W/SPECT W/WALL MOTION / EF
CHL CUP MPHR: 152 {beats}/min
CHL CUP NUCLEAR SSS: 3
CHL RATE OF PERCEIVED EXERTION: 13
CSEPED: 7 min
CSEPHR: 90 %
Estimated workload: 9 METS
Exercise duration (sec): 48 s
LHR: 0.36
LV dias vol: 103 mL (ref 62–150)
LV sys vol: 39 mL
Peak HR: 137 {beats}/min
Rest HR: 55 {beats}/min
SDS: 2
SRS: 1
TID: 0.95

## 2017-09-17 MED ORDER — TECHNETIUM TC 99M TETROFOSMIN IV KIT
30.0000 | PACK | Freq: Once | INTRAVENOUS | Status: AC | PRN
Start: 2017-09-17 — End: 2017-09-17
  Administered 2017-09-17: 33 via INTRAVENOUS

## 2017-09-17 MED ORDER — SODIUM CHLORIDE 0.9% FLUSH
INTRAVENOUS | Status: AC
  Administered 2017-09-17: 10 mL via INTRAVENOUS
  Filled 2017-09-17: qty 10

## 2017-09-17 MED ORDER — TECHNETIUM TC 99M TETROFOSMIN IV KIT
10.0000 | PACK | Freq: Once | INTRAVENOUS | Status: AC | PRN
  Administered 2017-09-17: 10.8 via INTRAVENOUS

## 2017-09-17 MED ORDER — REGADENOSON 0.4 MG/5ML IV SOLN
INTRAVENOUS | Status: AC
  Filled 2017-09-17: qty 5

## 2017-09-18 ENCOUNTER — Telehealth: Payer: Self-pay | Admitting: Cardiovascular Disease

## 2017-09-18 NOTE — Telephone Encounter (Signed)
New Message: ° ° ° ° ° ° °Pt is returning a call for test results ° °

## 2017-09-18 NOTE — Telephone Encounter (Signed)
Left message for patient to call back  

## 2017-09-18 NOTE — Telephone Encounter (Signed)
Patient called back about test results. Patient given results of stress test.

## 2017-10-23 ENCOUNTER — Other Ambulatory Visit: Payer: Self-pay | Admitting: Family Medicine

## 2017-11-16 ENCOUNTER — Other Ambulatory Visit: Payer: Self-pay | Admitting: Family Medicine

## 2017-12-14 ENCOUNTER — Other Ambulatory Visit: Payer: Self-pay | Admitting: Family Medicine

## 2017-12-15 ENCOUNTER — Ambulatory Visit (INDEPENDENT_AMBULATORY_CARE_PROVIDER_SITE_OTHER): Payer: Medicare Other | Admitting: Family Medicine

## 2017-12-15 ENCOUNTER — Encounter: Payer: Self-pay | Admitting: Family Medicine

## 2017-12-15 VITALS — BP 126/82 | Ht 69.0 in | Wt 194.6 lb

## 2017-12-15 DIAGNOSIS — Z1159 Encounter for screening for other viral diseases: Secondary | ICD-10-CM

## 2017-12-15 DIAGNOSIS — Z125 Encounter for screening for malignant neoplasm of prostate: Secondary | ICD-10-CM

## 2017-12-15 DIAGNOSIS — Z0000 Encounter for general adult medical examination without abnormal findings: Secondary | ICD-10-CM | POA: Diagnosis not present

## 2017-12-15 DIAGNOSIS — E782 Mixed hyperlipidemia: Secondary | ICD-10-CM | POA: Diagnosis not present

## 2017-12-15 DIAGNOSIS — I10 Essential (primary) hypertension: Secondary | ICD-10-CM | POA: Diagnosis not present

## 2017-12-15 DIAGNOSIS — R7303 Prediabetes: Secondary | ICD-10-CM

## 2017-12-15 MED ORDER — PRAVASTATIN SODIUM 20 MG PO TABS: ORAL_TABLET | ORAL | 1 refills | Status: DC

## 2017-12-15 MED ORDER — AMLODIPINE BESYLATE 5 MG PO TABS: ORAL_TABLET | ORAL | 1 refills | Status: DC

## 2017-12-15 NOTE — Patient Instructions (Signed)
DASH Eating Plan DASH stands for "Dietary Approaches to Stop Hypertension." The DASH eating plan is a healthy eating plan that has been shown to reduce high blood pressure (hypertension). It may also reduce your risk for type 2 diabetes, heart disease, and stroke. The DASH eating plan may also help with weight loss. What are tips for following this plan? General guidelines  Avoid eating more than 2,300 mg (milligrams) of salt (sodium) a day. If you have hypertension, you may need to reduce your sodium intake to 1,500 mg a day.  Limit alcohol intake to no more than 1 drink a day for nonpregnant women and 2 drinks a day for men. One drink equals 12 oz of beer, 5 oz of wine, or 1 oz of hard liquor.  Work with your health care provider to maintain a healthy body weight or to lose weight. Ask what an ideal weight is for you.  Get at least 30 minutes of exercise that causes your heart to beat faster (aerobic exercise) most days of the week. Activities may include walking, swimming, or biking.  Work with your health care provider or diet and nutrition specialist (dietitian) to adjust your eating plan to your individual calorie needs. Reading food labels  Check food labels for the amount of sodium per serving. Choose foods with less than 5 percent of the Daily Value of sodium. Generally, foods with less than 300 mg of sodium per serving fit into this eating plan.  To find whole grains, look for the word "whole" as the first word in the ingredient list. Shopping  Buy products labeled as "low-sodium" or "no salt added."  Buy fresh foods. Avoid canned foods and premade or frozen meals. Cooking  Avoid adding salt when cooking. Use salt-free seasonings or herbs instead of table salt or sea salt. Check with your health care provider or pharmacist before using salt substitutes.  Do not fry foods. Cook foods using healthy methods such as baking, boiling, grilling, and broiling instead.  Cook with  heart-healthy oils, such as olive, canola, soybean, or sunflower oil. Meal planning   Eat a balanced diet that includes: ? 5 or more servings of fruits and vegetables each day. At each meal, try to fill half of your plate with fruits and vegetables. ? Up to 6-8 servings of whole grains each day. ? Less than 6 oz of lean meat, poultry, or fish each day. A 3-oz serving of meat is about the same size as a deck of cards. One egg equals 1 oz. ? 2 servings of low-fat dairy each day. ? A serving of nuts, seeds, or beans 5 times each week. ? Heart-healthy fats. Healthy fats called Omega-3 fatty acids are found in foods such as flaxseeds and coldwater fish, like sardines, salmon, and mackerel.  Limit how much you eat of the following: ? Canned or prepackaged foods. ? Food that is high in trans fat, such as fried foods. ? Food that is high in saturated fat, such as fatty meat. ? Sweets, desserts, sugary drinks, and other foods with added sugar. ? Full-fat dairy products.  Do not salt foods before eating.  Try to eat at least 2 vegetarian meals each week.  Eat more home-cooked food and less restaurant, buffet, and fast food.  When eating at a restaurant, ask that your food be prepared with less salt or no salt, if possible. What foods are recommended? The items listed may not be a complete list. Talk with your dietitian about what   dietary choices are best for you. Grains Whole-grain or whole-wheat bread. Whole-grain or whole-wheat pasta. Brown rice. Oatmeal. Quinoa. Bulgur. Whole-grain and low-sodium cereals. Pita bread. Low-fat, low-sodium crackers. Whole-wheat flour tortillas. Vegetables Fresh or frozen vegetables (raw, steamed, roasted, or grilled). Low-sodium or reduced-sodium tomato and vegetable juice. Low-sodium or reduced-sodium tomato sauce and tomato paste. Low-sodium or reduced-sodium canned vegetables. Fruits All fresh, dried, or frozen fruit. Canned fruit in natural juice (without  added sugar). Meat and other protein foods Skinless chicken or turkey. Ground chicken or turkey. Pork with fat trimmed off. Fish and seafood. Egg whites. Dried beans, peas, or lentils. Unsalted nuts, nut butters, and seeds. Unsalted canned beans. Lean cuts of beef with fat trimmed off. Low-sodium, lean deli meat. Dairy Low-fat (1%) or fat-free (skim) milk. Fat-free, low-fat, or reduced-fat cheeses. Nonfat, low-sodium ricotta or cottage cheese. Low-fat or nonfat yogurt. Low-fat, low-sodium cheese. Fats and oils Soft margarine without trans fats. Vegetable oil. Low-fat, reduced-fat, or light mayonnaise and salad dressings (reduced-sodium). Canola, safflower, olive, soybean, and sunflower oils. Avocado. Seasoning and other foods Herbs. Spices. Seasoning mixes without salt. Unsalted popcorn and pretzels. Fat-free sweets. What foods are not recommended? The items listed may not be a complete list. Talk with your dietitian about what dietary choices are best for you. Grains Baked goods made with fat, such as croissants, muffins, or some breads. Dry pasta or rice meal packs. Vegetables Creamed or fried vegetables. Vegetables in a cheese sauce. Regular canned vegetables (not low-sodium or reduced-sodium). Regular canned tomato sauce and paste (not low-sodium or reduced-sodium). Regular tomato and vegetable juice (not low-sodium or reduced-sodium). Pickles. Olives. Fruits Canned fruit in a light or heavy syrup. Fried fruit. Fruit in cream or butter sauce. Meat and other protein foods Fatty cuts of meat. Ribs. Fried meat. Bacon. Sausage. Bologna and other processed lunch meats. Salami. Fatback. Hotdogs. Bratwurst. Salted nuts and seeds. Canned beans with added salt. Canned or smoked fish. Whole eggs or egg yolks. Chicken or turkey with skin. Dairy Whole or 2% milk, cream, and half-and-half. Whole or full-fat cream cheese. Whole-fat or sweetened yogurt. Full-fat cheese. Nondairy creamers. Whipped toppings.  Processed cheese and cheese spreads. Fats and oils Butter. Stick margarine. Lard. Shortening. Ghee. Bacon fat. Tropical oils, such as coconut, palm kernel, or palm oil. Seasoning and other foods Salted popcorn and pretzels. Onion salt, garlic salt, seasoned salt, table salt, and sea salt. Worcestershire sauce. Tartar sauce. Barbecue sauce. Teriyaki sauce. Soy sauce, including reduced-sodium. Steak sauce. Canned and packaged gravies. Fish sauce. Oyster sauce. Cocktail sauce. Horseradish that you find on the shelf. Ketchup. Mustard. Meat flavorings and tenderizers. Bouillon cubes. Hot sauce and Tabasco sauce. Premade or packaged marinades. Premade or packaged taco seasonings. Relishes. Regular salad dressings. Where to find more information:  National Heart, Lung, and Blood Institute: www.nhlbi.nih.gov  American Heart Association: www.heart.org Summary  The DASH eating plan is a healthy eating plan that has been shown to reduce high blood pressure (hypertension). It may also reduce your risk for type 2 diabetes, heart disease, and stroke.  With the DASH eating plan, you should limit salt (sodium) intake to 2,300 mg a day. If you have hypertension, you may need to reduce your sodium intake to 1,500 mg a day.  When on the DASH eating plan, aim to eat more fresh fruits and vegetables, whole grains, lean proteins, low-fat dairy, and heart-healthy fats.  Work with your health care provider or diet and nutrition specialist (dietitian) to adjust your eating plan to your individual   calorie needs. This information is not intended to replace advice given to you by your health care provider. Make sure you discuss any questions you have with your health care provider. Document Released: 03/27/2011 Document Revised: 03/31/2016 Document Reviewed: 03/31/2016 Elsevier Interactive Patient Education  2018 [employer]  

## 2017-12-15 NOTE — Progress Notes (Signed)
Subjective:    Patient ID: Stanley Reyes, male    DOB: 05-Mar-1951, 67 y.o.   MRN: [id number]  Hypertension  This is a chronic problem. The current episode started more than 1 year ago. Pertinent negatives include no chest pain, headaches or shortness of breath. Risk factors for coronary artery disease include male gender and dyslipidemia. Treatments tried: norvasc. There are no compliance problems.    AWV- Annual Wellness Visit  The patient was seen for their annual wellness visit. The patient's past medical history, surgical history, and family history were reviewed. Pertinent vaccines were reviewed ( tetanus, pneumonia, shingles, flu) The patient's medication list was reviewed and updated.  The height and weight were entered.  BMI recorded in electronic record elsewhere  Cognitive screening was completed. Outcome of Mini - Cog: Passes   Falls /depression screening electronically recorded within record elsewhere  Current tobacco usage: Does not smoke (All patients who use tobacco were given written and verbal information on quitting)  Recent listing of emergency department/hospitalizations over the past year were reviewed.  current specialist the patient sees on a regular basis: Does not see any specialist on a regular basis   Medicare annual wellness visit patient questionnaire was reviewed.  A written screening schedule for the patient for the next 5-10 years was given. Appropriate discussion of followup regarding next visit was discussed.  Patient here for follow-up regarding cholesterol.  The patient does have hyperlipidemia.  Patient does try to maintain a reasonable diet.  Patient does take the medication on a regular basis.  Denies missing a dose.  The patient denies any obvious side effects.  Prior blood work results reviewed with the patient.  The patient is aware of his cholesterol goals and the need to keep it under good control to lessen the risk of  disease.      Review of Systems  Constitutional: Negative for diaphoresis and fatigue.  HENT: Negative for congestion and rhinorrhea.   Respiratory: Negative for cough and shortness of breath.   Cardiovascular: Negative for chest pain and leg swelling.  Gastrointestinal: Negative for abdominal pain and diarrhea.  Skin: Negative for color change and rash.  Neurological: Negative for dizziness and headaches.  Psychiatric/Behavioral: Negative for behavioral problems and confusion.       Objective:   Physical Exam  Constitutional: He appears well-nourished. No distress.  HENT:  Head: Normocephalic and atraumatic.  Eyes: Right eye exhibits no discharge. Left eye exhibits no discharge.  Neck: No tracheal deviation present.  Cardiovascular: Normal rate, regular rhythm and normal heart sounds.  No murmur heard. Pulmonary/Chest: Effort normal and breath sounds normal. No respiratory distress. He exhibits no tenderness.  Musculoskeletal: He exhibits no edema.  Lymphadenopathy:    He has no cervical adenopathy.  Neurological: He is alert. Coordination normal.  Skin: Skin is warm and dry.  Psychiatric: He has a normal mood and affect. His behavior is normal.  Vitals reviewed.   Prostate exam normal      Assessment & Plan:  HTN- Patient was seen today as part of a visit regarding hypertension. The importance of healthy diet and regular physical activity was discussed. The importance of compliance with medications discussed.  Ideal goal is to keep blood pressure low elevated levels certainly below 366/44 when possible.  The patient was counseled that keeping blood pressure under control lessen his risk of complications.  The importance of regular follow-ups was discussed with the patient.  Low-salt diet such as DASH recommended.  Regular  physical activity was recommended as well.  Patient was advised to keep regular follow-ups.  The patient was seen today as part of an evaluation  regarding hyperlipidemia.  Recent lab work has been reviewed with the patient as well as the goals for good cholesterol care.  In addition to this medications have been discussed the importance of compliance with diet and medications discussed as well.  Finally the patient is aware that poor control of cholesterol, noncompliance can dramatically increase the risk of complications. The patient will keep regular office visits and the patient does agreed to periodic lab work.  Adult wellness-complete.wellness physical was conducted today. Importance of diet and exercise were discussed in detail.  In addition to this a discussion regarding safety was also covered. We also reviewed over immunizations and gave recommendations regarding current immunization needed for age.  In addition to this additional areas were also touched on including: Preventative health exams needed:  Colonoscopy 2024  Patient was advised yearly wellness exam Follow-up 6 months

## 2017-12-16 LAB — BASIC METABOLIC PANEL
BUN/Creatinine Ratio: 14 (ref 10–24)
BUN: 16 mg/dL (ref 8–27)
CALCIUM: 10.2 mg/dL (ref 8.6–10.2)
CO2: 23 mmol/L (ref 20–29)
CREATININE: 1.16 mg/dL (ref 0.76–1.27)
Chloride: 104 mmol/L (ref 96–106)
GFR calc Af Amer: 74 mL/min/{1.73_m2} (ref >59)
GFR, EST NON AFRICAN AMERICAN: 64 mL/min/{1.73_m2} (ref >59)
GLUCOSE: 131 mg/dL — AB (ref 65–99)
Potassium: 5.7 mmol/L — ABNORMAL HIGH (ref 3.5–5.2)
Sodium: 142 mmol/L (ref 134–144)

## 2017-12-16 LAB — HEPATIC FUNCTION PANEL
ALBUMIN: 4.8 g/dL (ref 3.6–4.8)
ALT: 29 IU/L (ref 0–44)
AST: 30 IU/L (ref 0–40)
Alkaline Phosphatase: 88 IU/L (ref 39–117)
Bilirubin Total: 0.7 mg/dL (ref 0.0–1.2)
Bilirubin, Direct: 0.15 mg/dL (ref 0.00–0.40)
TOTAL PROTEIN: 7.3 g/dL (ref 6.0–8.5)

## 2017-12-16 LAB — LIPID PANEL
Chol/HDL Ratio: 3.8 ratio (ref 0.0–5.0)
Cholesterol, Total: 165 mg/dL (ref 100–199)
HDL: 44 mg/dL
LDL Calculated: 91 mg/dL (ref 0–99)
Triglycerides: 152 mg/dL — ABNORMAL HIGH (ref 0–149)
VLDL Cholesterol Cal: 30 mg/dL (ref 5–40)

## 2017-12-16 LAB — HEMOGLOBIN A1C
ESTIMATED AVERAGE GLUCOSE: 128 mg/dL
HEMOGLOBIN A1C: 6.1 % — AB (ref 4.8–5.6)

## 2017-12-16 LAB — PSA: Prostate Specific Ag, Serum: 0.5 ng/mL (ref 0.0–4.0)

## 2017-12-16 LAB — MICROALBUMIN / CREATININE URINE RATIO
CREATININE, UR: 79.3 mg/dL
Microalbumin, Urine: <3 ug/mL

## 2017-12-16 LAB — HEPATITIS C ANTIBODY

## 2017-12-17 ENCOUNTER — Other Ambulatory Visit: Payer: Self-pay | Admitting: Family Medicine

## 2017-12-17 ENCOUNTER — Other Ambulatory Visit: Payer: Self-pay

## 2017-12-17 DIAGNOSIS — E875 Hyperkalemia: Secondary | ICD-10-CM

## 2017-12-30 DIAGNOSIS — E875 Hyperkalemia: Secondary | ICD-10-CM | POA: Diagnosis not present

## 2017-12-31 ENCOUNTER — Encounter: Payer: Self-pay | Admitting: Family Medicine

## 2017-12-31 LAB — POTASSIUM: Potassium: 3.8 mmol/L (ref 3.5–5.2)

## 2018-04-29 ENCOUNTER — Telehealth: Payer: Self-pay | Admitting: Family Medicine

## 2018-04-29 DIAGNOSIS — R7303 Prediabetes: Secondary | ICD-10-CM

## 2018-04-29 DIAGNOSIS — I10 Essential (primary) hypertension: Secondary | ICD-10-CM

## 2018-04-29 DIAGNOSIS — E782 Mixed hyperlipidemia: Secondary | ICD-10-CM

## 2018-04-29 NOTE — Telephone Encounter (Signed)
Lipid, met 7, A1c

## 2018-04-29 NOTE — Telephone Encounter (Signed)
Last labs 11/2017: Lipid, Liver, HgbA1c, Microalbumin urine, Hep C and PSA

## 2018-04-29 NOTE — Telephone Encounter (Signed)
Pt needs labs ordered, has his 6 month follow up on 06/08/2018  Please call pt when done

## 2018-04-30 NOTE — Telephone Encounter (Signed)
Blood work ordered in Epic. Left message to return call to notify patient. 

## 2018-05-03 NOTE — Telephone Encounter (Signed)
Pt aware blood work orders have been placed.

## 2018-06-02 DIAGNOSIS — I10 Essential (primary) hypertension: Secondary | ICD-10-CM | POA: Diagnosis not present

## 2018-06-02 DIAGNOSIS — R7303 Prediabetes: Secondary | ICD-10-CM | POA: Diagnosis not present

## 2018-06-02 DIAGNOSIS — E782 Mixed hyperlipidemia: Secondary | ICD-10-CM | POA: Diagnosis not present

## 2018-06-03 LAB — HEMOGLOBIN A1C
Est. average glucose Bld gHb Est-mCnc: 123 mg/dL
Hgb A1c MFr Bld: 5.9 % — ABNORMAL HIGH (ref 4.8–5.6)

## 2018-06-03 LAB — BASIC METABOLIC PANEL WITH GFR
BUN/Creatinine Ratio: 14 (ref 10–24)
BUN: 16 mg/dL (ref 8–27)
CO2: 23 mmol/L (ref 20–29)
Calcium: 9.8 mg/dL (ref 8.6–10.2)
Chloride: 104 mmol/L (ref 96–106)
Creatinine, Ser: 1.14 mg/dL (ref 0.76–1.27)
GFR calc Af Amer: 75 mL/min/{1.73_m2}
GFR calc non Af Amer: 65 mL/min/{1.73_m2}
Glucose: 132 mg/dL — ABNORMAL HIGH (ref 65–99)
Potassium: 4.7 mmol/L (ref 3.5–5.2)
Sodium: 142 mmol/L (ref 134–144)

## 2018-06-03 LAB — LIPID PANEL
Chol/HDL Ratio: 3.4 ratio (ref 0.0–5.0)
Cholesterol, Total: 152 mg/dL (ref 100–199)
HDL: 45 mg/dL
LDL Calculated: 79 mg/dL (ref 0–99)
Triglycerides: 138 mg/dL (ref 0–149)
VLDL Cholesterol Cal: 28 mg/dL (ref 5–40)

## 2018-06-08 ENCOUNTER — Encounter: Payer: Self-pay | Admitting: Family Medicine

## 2018-06-08 ENCOUNTER — Ambulatory Visit (INDEPENDENT_AMBULATORY_CARE_PROVIDER_SITE_OTHER): Payer: Medicare Other | Admitting: Family Medicine

## 2018-06-08 VITALS — BP 132/86 | Wt 195.0 lb

## 2018-06-08 DIAGNOSIS — E782 Mixed hyperlipidemia: Secondary | ICD-10-CM

## 2018-06-08 DIAGNOSIS — I10 Essential (primary) hypertension: Secondary | ICD-10-CM | POA: Diagnosis not present

## 2018-06-08 DIAGNOSIS — R7303 Prediabetes: Secondary | ICD-10-CM

## 2018-06-08 MED ORDER — AMLODIPINE BESYLATE 5 MG PO TABS: ORAL_TABLET | ORAL | 1 refills | Status: DC

## 2018-06-08 MED ORDER — PRAVASTATIN SODIUM 20 MG PO TABS: ORAL_TABLET | ORAL | 1 refills | Status: DC

## 2018-06-08 NOTE — Patient Instructions (Signed)
DASH Eating Plan  DASH stands for "Dietary Approaches to Stop Hypertension." The DASH eating plan is a healthy eating plan that has been shown to reduce high blood pressure (hypertension). It may also reduce your risk for type 2 diabetes, heart disease, and stroke. The DASH eating plan may also help with weight loss.  What are tips for following this plan?    General guidelines   Avoid eating more than 2,300 mg (milligrams) of salt (sodium) a day. If you have hypertension, you may need to reduce your sodium intake to 1,500 mg a day.   Limit alcohol intake to no more than 1 drink a day for nonpregnant women and 2 drinks a day for men. One drink equals 12 oz of beer, 5 oz of wine, or 1 oz of hard liquor.   Work with your health care provider to maintain a healthy body weight or to lose weight. Ask what an ideal weight is for you.   Get at least 30 minutes of exercise that causes your heart to beat faster (aerobic exercise) most days of the week. Activities may include walking, swimming, or biking.   Work with your health care provider or diet and nutrition specialist (dietitian) to adjust your eating plan to your individual calorie needs.  Reading food labels     Check food labels for the amount of sodium per serving. Choose foods with less than 5 percent of the Daily Value of sodium. Generally, foods with less than 300 mg of sodium per serving fit into this eating plan.   To find whole grains, look for the word "whole" as the first word in the ingredient list.  Shopping   Buy products labeled as "low-sodium" or "no salt added."   Buy fresh foods. Avoid canned foods and premade or frozen meals.  Cooking   Avoid adding salt when cooking. Use salt-free seasonings or herbs instead of table salt or sea salt. Check with your health care provider or pharmacist before using salt substitutes.   Do not fry foods. Cook foods using healthy methods such as baking, boiling, grilling, and broiling instead.   Cook with  heart-healthy oils, such as olive, canola, soybean, or sunflower oil.  Meal planning   Eat a balanced diet that includes:  ? 5 or more servings of fruits and vegetables each day. At each meal, try to fill half of your plate with fruits and vegetables.  ? Up to 6-8 servings of whole grains each day.  ? Less than 6 oz of lean meat, poultry, or fish each day. A 3-oz serving of meat is about the same size as a deck of cards. One egg equals 1 oz.  ? 2 servings of low-fat dairy each day.  ? A serving of nuts, seeds, or beans 5 times each week.  ? Heart-healthy fats. Healthy fats called Omega-3 fatty acids are found in foods such as flaxseeds and coldwater fish, like sardines, salmon, and mackerel.   Limit how much you eat of the following:  ? Canned or prepackaged foods.  ? Food that is high in trans fat, such as fried foods.  ? Food that is high in saturated fat, such as fatty meat.  ? Sweets, desserts, sugary drinks, and other foods with added sugar.  ? Full-fat dairy products.   Do not salt foods before eating.   Try to eat at least 2 vegetarian meals each week.   Eat more home-cooked food and less restaurant, buffet, and fast food.     When eating at a restaurant, ask that your food be prepared with less salt or no salt, if possible.  What foods are recommended?  The items listed may not be a complete list. Talk with your dietitian about what dietary choices are best for you.  Grains  Whole-grain or whole-wheat bread. Whole-grain or whole-wheat pasta. Brown rice. Oatmeal. Quinoa. Bulgur. Whole-grain and low-sodium cereals. Pita bread. Low-fat, low-sodium crackers. Whole-wheat flour tortillas.  Vegetables  Fresh or frozen vegetables (raw, steamed, roasted, or grilled). Low-sodium or reduced-sodium tomato and vegetable juice. Low-sodium or reduced-sodium tomato sauce and tomato paste. Low-sodium or reduced-sodium canned vegetables.  Fruits  All fresh, dried, or frozen fruit. Canned fruit in natural juice (without  added sugar).  Meat and other protein foods  Skinless chicken or turkey. Ground chicken or turkey. Pork with fat trimmed off. Fish and seafood. Egg whites. Dried beans, peas, or lentils. Unsalted nuts, nut butters, and seeds. Unsalted canned beans. Lean cuts of beef with fat trimmed off. Low-sodium, lean deli meat.  Dairy  Low-fat (1%) or fat-free (skim) milk. Fat-free, low-fat, or reduced-fat cheeses. Nonfat, low-sodium ricotta or cottage cheese. Low-fat or nonfat yogurt. Low-fat, low-sodium cheese.  Fats and oils  Soft margarine without trans fats. Vegetable oil. Low-fat, reduced-fat, or light mayonnaise and salad dressings (reduced-sodium). Canola, safflower, olive, soybean, and sunflower oils. Avocado.  Seasoning and other foods  Herbs. Spices. Seasoning mixes without salt. Unsalted popcorn and pretzels. Fat-free sweets.  What foods are not recommended?  The items listed may not be a complete list. Talk with your dietitian about what dietary choices are best for you.  Grains  Baked goods made with fat, such as croissants, muffins, or some breads. Dry pasta or rice meal packs.  Vegetables  Creamed or fried vegetables. Vegetables in a cheese sauce. Regular canned vegetables (not low-sodium or reduced-sodium). Regular canned tomato sauce and paste (not low-sodium or reduced-sodium). Regular tomato and vegetable juice (not low-sodium or reduced-sodium). Pickles. Olives.  Fruits  Canned fruit in a light or heavy syrup. Fried fruit. Fruit in cream or butter sauce.  Meat and other protein foods  Fatty cuts of meat. Ribs. Fried meat. Bacon. Sausage. Bologna and other processed lunch meats. Salami. Fatback. Hotdogs. Bratwurst. Salted nuts and seeds. Canned beans with added salt. Canned or smoked fish. Whole eggs or egg yolks. Chicken or turkey with skin.  Dairy  Whole or 2% milk, cream, and half-and-half. Whole or full-fat cream cheese. Whole-fat or sweetened yogurt. Full-fat cheese. Nondairy creamers. Whipped toppings.  Processed cheese and cheese spreads.  Fats and oils  Butter. Stick margarine. Lard. Shortening. Ghee. Bacon fat. Tropical oils, such as coconut, palm kernel, or palm oil.  Seasoning and other foods  Salted popcorn and pretzels. Onion salt, garlic salt, seasoned salt, table salt, and sea salt. Worcestershire sauce. Tartar sauce. Barbecue sauce. Teriyaki sauce. Soy sauce, including reduced-sodium. Steak sauce. Canned and packaged gravies. Fish sauce. Oyster sauce. Cocktail sauce. Horseradish that you find on the shelf. Ketchup. Mustard. Meat flavorings and tenderizers. Bouillon cubes. Hot sauce and Tabasco sauce. Premade or packaged marinades. Premade or packaged taco seasonings. Relishes. Regular salad dressings.  Where to find more information:   National Heart, Lung, and Blood Institute: www.nhlbi.nih.gov   American Heart Association: www.heart.org  Summary   The DASH eating plan is a healthy eating plan that has been shown to reduce high blood pressure (hypertension). It may also reduce your risk for type 2 diabetes, heart disease, and stroke.   With the   DASH eating plan, you should limit salt (sodium) intake to 2,300 mg a day. If you have hypertension, you may need to reduce your sodium intake to 1,500 mg a day.   When on the DASH eating plan, aim to eat more fresh fruits and vegetables, whole grains, lean proteins, low-fat dairy, and heart-healthy fats.   Work with your health care provider or diet and nutrition specialist (dietitian) to adjust your eating plan to your individual calorie needs.  This information is not intended to replace advice given to you by your health care provider. Make sure you discuss any questions you have with your health care provider.  Document Released: 03/27/2011 Document Revised: 03/31/2016 Document Reviewed: 03/31/2016  Elsevier Interactive Patient Education  2019 [employer]

## 2018-06-08 NOTE — Progress Notes (Signed)
Subjective:    Patient ID: Stanley Reyes, male    DOB: [birth year], 68 y.o.   MRN: [id number]  Hypertension  This is a chronic problem. Pertinent negatives include no chest pain, headaches or shortness of breath. There are no compliance problems.    Pt here today for 6 month follow up. Pt states he check his BP about once a week. No problems otherwise. Pt states that his BP has been running good at home.  Patient for blood pressure check up.  The patient does have hypertension.  The patient is on medication.  Patient relates compliance with meds. Todays BP reviewed with the patient. Patient denies issues with medication. Patient relates reasonable diet. Patient tries to minimize salt. Patient aware of BP goals.  Patient here for follow-up regarding cholesterol.  The patient does have hyperlipidemia.  Patient does try to maintain a reasonable diet.  Patient does take the medication on a regular basis.  Denies missing a dose.  The patient denies any obvious side effects.  Prior blood work results reviewed with the patient.  The patient is aware of his cholesterol goals and the need to keep it under good control to lessen the risk of disease.   Pt has had lab work completed on 06/02/2018.  Results for orders placed or performed in visit on 04/29/18  Lipid panel  Result Value Ref Range   Cholesterol, Total 152 100 - 199 mg/dL   Triglycerides 138 0 - 149 mg/dL   HDL 45 >39 mg/dL   VLDL Cholesterol Cal 28 5 - 40 mg/dL   LDL Calculated 79 0 - 99 mg/dL   Chol/HDL Ratio 3.4 0.0 - 5.0 ratio  Basic metabolic panel  Result Value Ref Range   Glucose 132 (H) 65 - 99 mg/dL   BUN 16 8 - 27 mg/dL   Creatinine, Ser 1.14 0.76 - 1.27 mg/dL   GFR calc non Af Amer 65 >59 mL/min/1.73   GFR calc Af Amer 75 >59 mL/min/1.73   BUN/Creatinine Ratio 14 10 - 24   Sodium 142 134 - 144 mmol/L   Potassium 4.7 3.5 - 5.2 mmol/L   Chloride 104 96 - 106 mmol/L   CO2 23 20 - 29 mmol/L   Calcium 9.8 8.6 - 10.2  mg/dL  Hemoglobin A1c  Result Value Ref Range   Hgb A1c MFr Bld 5.9 (H) 4.8 - 5.6 %   Est. average glucose Bld gHb Est-mCnc 123 mg/dL    Review of Systems  Constitutional: Negative for diaphoresis and fatigue.  HENT: Negative for congestion and rhinorrhea.   Respiratory: Negative for cough and shortness of breath.   Cardiovascular: Negative for chest pain and leg swelling.  Gastrointestinal: Negative for abdominal pain and diarrhea.  Skin: Negative for color change and rash.  Neurological: Negative for dizziness and headaches.  Psychiatric/Behavioral: Negative for behavioral problems and confusion.       Objective:   Physical Exam Vitals signs reviewed.  Constitutional:      General: He is not in acute distress. HENT:     Head: Normocephalic and atraumatic.  Eyes:     General:        Right eye: No discharge.        Left eye: No discharge.  Neck:     Trachea: No tracheal deviation.  Cardiovascular:     Rate and Rhythm: Normal rate and regular rhythm.     Heart sounds: Normal heart sounds. No murmur.  Pulmonary:  Effort: Pulmonary effort is normal. No respiratory distress.     Breath sounds: Normal breath sounds.  Lymphadenopathy:     Cervical: No cervical adenopathy.  Skin:    General: Skin is warm and dry.  Neurological:     Mental Status: He is alert.     Coordination: Coordination normal.  Psychiatric:        Behavior: Behavior normal.           Assessment & Plan:  HTN- Patient was seen today as part of a visit regarding hypertension. The importance of healthy diet and regular physical activity was discussed. The importance of compliance with medications discussed.  Ideal goal is to keep blood pressure low elevated levels certainly below 165/53 when possible.  The patient was counseled that keeping blood pressure under control lessen his risk of complications.  The importance of regular follow-ups was discussed with the patient.  Low-salt diet such as  DASH recommended.  Regular physical activity was recommended as well.  Patient was advised to keep regular follow-ups.  Prediabetes very important to keep diet under good control exercise on a regular basis follow-up in 6 months for wellness and med check  Continue cholesterol medicine labs were reviewed with patient eat healthy stay active

## 2018-06-10 DIAGNOSIS — H5202 Hypermetropia, left eye: Secondary | ICD-10-CM | POA: Diagnosis not present

## 2018-09-01 IMAGING — NM NM MYOCAR MULTI W/SPECT W/WALL MOTION & EF
2 series · 12 of 12 positions shown · non-contrast
Comparison: none

[Series 1: rest · 6.51mm/px · 6 of 64 frames shown]
[frame 6/64]
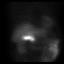
[frame 16/64]
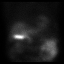
[frame 27/64]
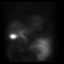
[frame 38/64]
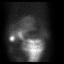
[frame 48/64]
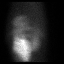
[frame 59/64]
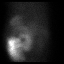

[Series 3: stress gated - perfusion · 6.51mm/px · 6 of 64 frames shown]
[frame 6/64]
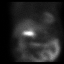
[frame 16/64]
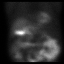
[frame 27/64]
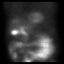
[frame 38/64]
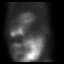
[frame 48/64]
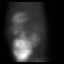
[frame 59/64]
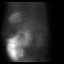

[12 of 12 positions shown; findings below may reference images not displayed]

Canned report from images found in remote index.

Refer to host system for actual result text.

## 2018-10-04 DIAGNOSIS — R42 Dizziness and giddiness: Secondary | ICD-10-CM | POA: Diagnosis not present

## 2018-11-22 ENCOUNTER — Other Ambulatory Visit: Payer: Self-pay | Admitting: Family Medicine

## 2018-11-23 NOTE — Telephone Encounter (Signed)
Please set up follow-up visit within the next 60 days may have 1 refill

## 2018-12-03 ENCOUNTER — Telehealth: Payer: Self-pay | Admitting: Family Medicine

## 2018-12-03 DIAGNOSIS — Z125 Encounter for screening for malignant neoplasm of prostate: Secondary | ICD-10-CM

## 2018-12-03 DIAGNOSIS — R7303 Prediabetes: Secondary | ICD-10-CM

## 2018-12-03 DIAGNOSIS — I10 Essential (primary) hypertension: Secondary | ICD-10-CM

## 2018-12-03 DIAGNOSIS — E782 Mixed hyperlipidemia: Secondary | ICD-10-CM

## 2018-12-03 NOTE — Telephone Encounter (Signed)
Last labs completed on 06/02/2018 A1C, Bmet and lipid. Please advise. Thank you

## 2018-12-03 NOTE — Telephone Encounter (Signed)
PSA, lipid, liver, metabolic 7, [id number] labs plus hyperlipidemia plus prediabetes

## 2018-12-03 NOTE — Telephone Encounter (Signed)
Lab orders placed and pt verbalized understanding. Pt cancelled the appt for 8/18 because it is for wellness; his last wellness was 12/15/17. Pt states this happened last year and his insurance did not want to pay. Pt has been rescheduled for later date

## 2018-12-03 NOTE — Telephone Encounter (Signed)
[facility] called stating that the pt arrived to get labs done but there are no orders in system  Asked her to let pt know I would ask for orders to be placed & we would call the pt so he could go back over to get them done.  Please advise & call pt home# [phone], cell# [phone]

## 2018-12-07 ENCOUNTER — Encounter: Payer: Medicare Other | Admitting: Family Medicine

## 2018-12-07 DIAGNOSIS — E782 Mixed hyperlipidemia: Secondary | ICD-10-CM | POA: Diagnosis not present

## 2018-12-07 DIAGNOSIS — R7303 Prediabetes: Secondary | ICD-10-CM | POA: Diagnosis not present

## 2018-12-07 DIAGNOSIS — Z125 Encounter for screening for malignant neoplasm of prostate: Secondary | ICD-10-CM | POA: Diagnosis not present

## 2018-12-07 DIAGNOSIS — I10 Essential (primary) hypertension: Secondary | ICD-10-CM | POA: Diagnosis not present

## 2018-12-08 LAB — BASIC METABOLIC PANEL
BUN/Creatinine Ratio: 18 (ref 10–24)
BUN: 21 mg/dL (ref 8–27)
CO2: 23 mmol/L (ref 20–29)
Calcium: 9.5 mg/dL (ref 8.6–10.2)
Chloride: 103 mmol/L (ref 96–106)
Creatinine, Ser: 1.2 mg/dL (ref 0.76–1.27)
GFR calc Af Amer: 70 mL/min/{1.73_m2} (ref >59)
GFR calc non Af Amer: 61 mL/min/{1.73_m2} (ref >59)
Glucose: 128 mg/dL — ABNORMAL HIGH (ref 65–99)
Potassium: 4.1 mmol/L (ref 3.5–5.2)
Sodium: 142 mmol/L (ref 134–144)

## 2018-12-08 LAB — HEPATIC FUNCTION PANEL
ALT: 28 IU/L (ref 0–44)
AST: 26 IU/L (ref 0–40)
Albumin: 4.6 g/dL (ref 3.8–4.8)
Alkaline Phosphatase: 78 IU/L (ref 39–117)
Bilirubin Total: 0.6 mg/dL (ref 0.0–1.2)
Bilirubin, Direct: 0.15 mg/dL (ref 0.00–0.40)
Total Protein: 6.9 g/dL (ref 6.0–8.5)

## 2018-12-08 LAB — LIPID PANEL
Chol/HDL Ratio: 4.5 ratio (ref 0.0–5.0)
Cholesterol, Total: 166 mg/dL (ref 100–199)
HDL: 37 mg/dL — ABNORMAL LOW (ref >39)
LDL Calculated: 89 mg/dL (ref 0–99)
Triglycerides: 200 mg/dL — ABNORMAL HIGH (ref 0–149)
VLDL Cholesterol Cal: 40 mg/dL (ref 5–40)

## 2018-12-08 LAB — HEMOGLOBIN A1C
Est. average glucose Bld gHb Est-mCnc: 126 mg/dL
Hgb A1c MFr Bld: 6 % — ABNORMAL HIGH (ref 4.8–5.6)

## 2018-12-08 LAB — PSA: Prostate Specific Ag, Serum: 0.6 ng/mL (ref 0.0–4.0)

## 2018-12-09 ENCOUNTER — Other Ambulatory Visit: Payer: Self-pay | Admitting: Family Medicine

## 2018-12-28 ENCOUNTER — Other Ambulatory Visit: Payer: Self-pay

## 2018-12-28 ENCOUNTER — Encounter: Payer: Self-pay | Admitting: Family Medicine

## 2018-12-28 ENCOUNTER — Ambulatory Visit (INDEPENDENT_AMBULATORY_CARE_PROVIDER_SITE_OTHER): Payer: Medicare Other | Admitting: Family Medicine

## 2018-12-28 VITALS — BP 137/80 | Temp 97.8°F | Ht 69.0 in | Wt 198.2 lb

## 2018-12-28 DIAGNOSIS — R7303 Prediabetes: Secondary | ICD-10-CM | POA: Diagnosis not present

## 2018-12-28 DIAGNOSIS — E782 Mixed hyperlipidemia: Secondary | ICD-10-CM

## 2018-12-28 DIAGNOSIS — Z0000 Encounter for general adult medical examination without abnormal findings: Secondary | ICD-10-CM | POA: Diagnosis not present

## 2018-12-28 DIAGNOSIS — Z23 Encounter for immunization: Secondary | ICD-10-CM

## 2018-12-28 DIAGNOSIS — I10 Essential (primary) hypertension: Secondary | ICD-10-CM

## 2018-12-28 MED ORDER — PRAVASTATIN SODIUM 20 MG PO TABS: ORAL_TABLET | ORAL | 1 refills | Status: DC

## 2018-12-28 MED ORDER — SHINGRIX 50 MCG/0.5ML IM SUSR: 0.5000 mL | Freq: Once | INTRAMUSCULAR | 1 refills | Status: AC

## 2018-12-28 MED ORDER — AMLODIPINE BESYLATE 5 MG PO TABS: ORAL_TABLET | ORAL | 1 refills | Status: DC

## 2018-12-28 NOTE — Progress Notes (Signed)
Subjective:    Patient ID: Stanley Reyes, male    DOB: 08/17/1950, 68 y.o.   MRN: [id number]  HPI AWV- Annual Wellness Visit Patient has prediabetes Tries to watch starches in his diet to a degree Did do his lab work recently This was reviewed with him.  Also has high blood pressure takes his medicine regular basis does need refills.  States his blood pressure readings under good control.  Denies excessive salt use  Patient also with hyperlipidemia did do lab work.  Takes his medicine on a regular basis does not have heart disease.  But does state that he had carotid artery ultrasound done earlier this year which showed mild blockage he does take an 81 mg aspirin The patient was seen for their annual wellness visit. The patient's past medical history, surgical history, and family history were reviewed. Pertinent vaccines were reviewed ( tetanus, pneumonia, shingles, flu) The patient's medication list was reviewed and updated.  The height and weight were entered.  BMI recorded in electronic record elsewhere  Cognitive screening was completed. Outcome of Mini - Cog:    Falls /depression screening electronically recorded within record elsewhere  Current tobacco usage: none (All patients who use tobacco were given written and verbal information on quitting)  Recent listing of emergency department/hospitalizations over the past year were reviewed.  current specialist the patient sees on a regular basis: has been seeing chiropractor for inner ear issues    Medicare annual wellness visit patient questionnaire was reviewed.  A written screening schedule for the patient for the next 5-10 years was given. Appropriate discussion of followup regarding next visit was discussed.      Review of Systems  Constitutional: Negative for activity change, appetite change and fever.  HENT: Negative for congestion and rhinorrhea.   Eyes: Negative for discharge.  Respiratory: Negative  for cough and wheezing.   Cardiovascular: Negative for chest pain.  Gastrointestinal: Negative for abdominal pain, blood in stool and vomiting.  Genitourinary: Negative for difficulty urinating and frequency.  Musculoskeletal: Negative for neck pain.  Skin: Negative for rash.  Allergic/Immunologic: Negative for environmental allergies and food allergies.  Neurological: Negative for weakness and headaches.  Psychiatric/Behavioral: Negative for agitation.       Objective:   Physical Exam Constitutional:      Appearance: He is well-developed.  HENT:     Head: Normocephalic and atraumatic.     Right Ear: External ear normal.     Left Ear: External ear normal.     Nose: Nose normal.  Eyes:     Pupils: Pupils are equal, round, and reactive to light.  Neck:     Musculoskeletal: Normal range of motion and neck supple.     Thyroid: No thyromegaly.  Cardiovascular:     Rate and Rhythm: Normal rate and regular rhythm.     Heart sounds: Normal heart sounds. No murmur.  Pulmonary:     Effort: Pulmonary effort is normal. No respiratory distress.     Breath sounds: Normal breath sounds. No wheezing.  Abdominal:     General: Bowel sounds are normal. There is no distension.     Palpations: Abdomen is soft. There is no mass.     Tenderness: There is no abdominal tenderness.  Genitourinary:    Penis: Normal.   Musculoskeletal: Normal range of motion.  Lymphadenopathy:     Cervical: No cervical adenopathy.  Skin:    General: Skin is warm and dry.     Findings: No  erythema.  Neurological:     Mental Status: He is alert.     Motor: No abnormal muscle tone.  Psychiatric:        Behavior: Behavior normal.        Judgment: Judgment normal.   Prostate exam normal Lab work reviewed in detail Results for orders placed or performed in visit on 12/03/18  PSA  Result Value Ref Range   Prostate Specific Ag, Serum 0.6 0.0 - 4.0 ng/mL  Lipid Profile  Result Value Ref Range   Cholesterol,  Total 166 100 - 199 mg/dL   Triglycerides 200 (H) 0 - 149 mg/dL   HDL 37 (L) >39 mg/dL   VLDL Cholesterol Cal 40 5 - 40 mg/dL   LDL Calculated 89 0 - 99 mg/dL   Chol/HDL Ratio 4.5 0.0 - 5.0 ratio  Hepatic function panel  Result Value Ref Range   Total Protein 6.9 6.0 - 8.5 g/dL   Albumin 4.6 3.8 - 4.8 g/dL   Bilirubin Total 0.6 0.0 - 1.2 mg/dL   Bilirubin, Direct 0.15 0.00 - 0.40 mg/dL   Alkaline Phosphatase 78 39 - 117 IU/L   AST 26 0 - 40 IU/L   ALT 28 0 - 44 IU/L  Basic Metabolic Panel (BMET)  Result Value Ref Range   Glucose 128 (H) 65 - 99 mg/dL   BUN 21 8 - 27 mg/dL   Creatinine, Ser 1.20 0.76 - 1.27 mg/dL   GFR calc non Af Amer 61 >59 mL/min/1.73   GFR calc Af Amer 70 >59 mL/min/1.73   BUN/Creatinine Ratio 18 10 - 24   Sodium 142 134 - 144 mmol/L   Potassium 4.1 3.5 - 5.2 mmol/L   Chloride 103 96 - 106 mmol/L   CO2 23 20 - 29 mmol/L   Calcium 9.5 8.6 - 10.2 mg/dL  Hemoglobin A1c  Result Value Ref Range   Hgb A1c MFr Bld 6.0 (H) 4.8 - 5.6 %   Est. average glucose Bld gHb Est-mCnc 126 mg/dL           Assessment & Plan:  Adult wellness-complete.wellness physical was conducted today. Importance of diet and exercise were discussed in detail.  In addition to this a discussion regarding safety was also covered. We also reviewed over immunizations and gave recommendations regarding current immunization needed for age.  In addition to this additional areas were also touched on including: Preventative health exams needed:  Colonoscopy not due currently next 04/2022  Patient was advised yearly wellness exam   Blood pressure good control refills given continue medication watch diet stay active  Hyperlipidemia previous labs reviewed continue medication watch diet  Prediabetes A1c acceptable control by diet and exercise  Separate identifiable health issues were reviewed during this time which was separate from annual wellness visit and medically indicated and necessary

## 2018-12-28 NOTE — Patient Instructions (Addendum)
Shingrix and shingles prevention: know the facts!   Shingrix is a very effective vaccine to prevent shingles.   Shingles is a reactivation of chickenpox -more than 99% of Americans born before 1980 have had chickenpox even if they do not remember it. One in every 10 people who get shingles have severe long-lasting nerve pain as a result.   33 out of a 100 older adults will get shingles if they are unvaccinated.     This vaccine is very important for your health This vaccine is indicated for anyone 50 years or older. You can get this vaccine even if you have already had shingles because you can get the disease more than once in a lifetime.  Your risk for shingles and its complications increases with age.  This vaccine has 2 doses.  The second dose would be 2 to 6 months after the first dose.  If you had Zostavax vaccine in the past you should still get Shingrix. ( Zostavax is only 70% effective and it loses significant strength over a few years .)  This vaccine is given through the pharmacy.  The cost of the vaccine is through your insurance. The pharmacy can inform you of the total costs.  Common side effects including soreness in the arm, some redness and swelling, also some feel fatigue muscle soreness headache low-grade fever.  Side effects typically go away within 2 to 3 days. Remember-the pain from shingles can last a lifetime but these side effects of the vaccine will only last a few days at most. It is very important to get both doses in order to protect yourself fully.   Please get this vaccine at your earliest convenience at your trusted pharmacy.    Results for orders placed or performed in visit on 12/03/18  PSA  Result Value Ref Range   Prostate Specific Ag, Serum 0.6 0.0 - 4.0 ng/mL  Lipid Profile  Result Value Ref Range   Cholesterol, Total 166 100 - 199 mg/dL   Triglycerides 200 (H) 0 - 149 mg/dL   HDL 37 (L) >39 mg/dL   VLDL Cholesterol Cal 40 5 - 40 mg/dL   LDL  Calculated 89 0 - 99 mg/dL   Chol/HDL Ratio 4.5 0.0 - 5.0 ratio  Hepatic function panel  Result Value Ref Range   Total Protein 6.9 6.0 - 8.5 g/dL   Albumin 4.6 3.8 - 4.8 g/dL   Bilirubin Total 0.6 0.0 - 1.2 mg/dL   Bilirubin, Direct 0.15 0.00 - 0.40 mg/dL   Alkaline Phosphatase 78 39 - 117 IU/L   AST 26 0 - 40 IU/L   ALT 28 0 - 44 IU/L  Basic Metabolic Panel (BMET)  Result Value Ref Range   Glucose 128 (H) 65 - 99 mg/dL   BUN 21 8 - 27 mg/dL   Creatinine, Ser 1.20 0.76 - 1.27 mg/dL   GFR calc non Af Amer 61 >59 mL/min/1.73   GFR calc Af Amer 70 >59 mL/min/1.73   BUN/Creatinine Ratio 18 10 - 24   Sodium 142 134 - 144 mmol/L   Potassium 4.1 3.5 - 5.2 mmol/L   Chloride 103 96 - 106 mmol/L   CO2 23 20 - 29 mmol/L   Calcium 9.5 8.6 - 10.2 mg/dL  Hemoglobin A1c  Result Value Ref Range   Hgb A1c MFr Bld 6.0 (H) 4.8 - 5.6 %   Est. average glucose Bld gHb Est-mCnc 126 mg/dL

## 2019-01-13 ENCOUNTER — Telehealth: Payer: Self-pay | Admitting: Family Medicine

## 2019-01-13 DIAGNOSIS — M109 Gout, unspecified: Secondary | ICD-10-CM

## 2019-01-13 MED ORDER — PREDNISONE 20 MG PO TABS: ORAL_TABLET | ORAL | 0 refills | Status: DC

## 2019-01-13 MED ORDER — COLCHICINE 0.6 MG PO TABS: ORAL_TABLET | ORAL | 2 refills | Status: DC

## 2019-01-13 NOTE — Telephone Encounter (Signed)
Patient is requesting something for gout in his big toe. He has had this since last Thursday 9/17 he was seen 12/28/18 for his wellness. Patient states this has been going on for awhile on and off with swelling in his toe. [place]

## 2019-01-13 NOTE — Telephone Encounter (Signed)
Please advise. Thank you

## 2019-01-13 NOTE — Telephone Encounter (Signed)
Pt contacted and verbalized understanding. Medication sent to pharmacy and lab orders placed 

## 2019-01-13 NOTE — Telephone Encounter (Signed)
Colchicine 0.6 mg 1 twice daily PRN gout pain Best to take this twice daily until foot is doing better It is possible this could cause diarrhea #30 with 2 refills  Also I recommend uric acid level through the lab may need to be on allopurinol Healthy eating minimize processed foods minimize cheese minimize knots minimize processed meats  Also short course of prednisone 20 mg tablet, 2 daily for the next 5 days This will temporarily cause his sugars to go up but will help with the gout

## 2019-01-14 DIAGNOSIS — M109 Gout, unspecified: Secondary | ICD-10-CM | POA: Diagnosis not present

## 2019-01-15 ENCOUNTER — Encounter: Payer: Self-pay | Admitting: Family Medicine

## 2019-01-15 LAB — URIC ACID: Uric Acid: 5.7 mg/dL (ref 3.7–8.6)

## 2019-06-13 ENCOUNTER — Ambulatory Visit: Payer: Medicare Other | Admitting: Family Medicine

## 2019-06-20 ENCOUNTER — Other Ambulatory Visit: Payer: Self-pay

## 2019-06-20 ENCOUNTER — Ambulatory Visit (INDEPENDENT_AMBULATORY_CARE_PROVIDER_SITE_OTHER): Payer: Medicare Other | Admitting: Family Medicine

## 2019-06-20 DIAGNOSIS — E782 Mixed hyperlipidemia: Secondary | ICD-10-CM | POA: Diagnosis not present

## 2019-06-20 DIAGNOSIS — R7303 Prediabetes: Secondary | ICD-10-CM

## 2019-06-20 DIAGNOSIS — I10 Essential (primary) hypertension: Secondary | ICD-10-CM | POA: Diagnosis not present

## 2019-06-20 MED ORDER — PRAVASTATIN SODIUM 20 MG PO TABS: ORAL_TABLET | ORAL | 1 refills | Status: DC

## 2019-06-20 MED ORDER — AMLODIPINE BESYLATE 5 MG PO TABS: ORAL_TABLET | ORAL | 1 refills | Status: DC

## 2019-06-20 NOTE — Progress Notes (Signed)
   Subjective:    Patient ID: Stanley Reyes, male    DOB: [birth year], 69 y.o.   MRN: [id number]  Hyperlipidemia This is a chronic problem. Pertinent negatives include no chest pain or shortness of breath. Treatments tried: pravastatin.   Fall Risk  01/22/2017 01/22/2015  Falls in the past year? No No   Patient for blood pressure check up.  The patient does have hypertension.  The patient is on medication.  Patient relates compliance with meds. Todays BP reviewed with the patient. Patient denies issues with medication. Patient relates reasonable diet. Patient tries to minimize salt. Patient aware of BP goals. States he is taking his blood pressure medicine regular basis Patient here for follow-up regarding cholesterol.  The patient does have hyperlipidemia.  Patient does try to maintain a reasonable diet.  Patient does take the medication on a regular basis.  Denies missing a dose.  The patient denies any obvious side effects.  Prior blood work results reviewed with the patient.  The patient is aware of his cholesterol goals and the need to keep it under good control to lessen the risk of disease. Taking his medicine regular basis watching diet closely Pt states no concerns today.  Virtual Visit via Telephone Note  I connected with Stanley Reyes on 06/20/19 at  1:40 PM EST by telephone and verified that I am speaking with the correct person using two identifiers.  Location: Patient: home  Provider: office   I discussed the limitations, risks, security and privacy concerns of performing an evaluation and management service by telephone and the availability of in person appointments. I also discussed with the patient that there may be a patient responsible charge related to this service. The patient expressed understanding and agreed to proceed.   History of Present Illness:    Observations/Objective:   Assessment and Plan:   Follow Up Instructions:    I discussed the  assessment and treatment plan with the patient. The patient was provided an opportunity to ask questions and all were answered. The patient agreed with the plan and demonstrated an understanding of the instructions.   The patient was advised to call back or seek an in-person evaluation if the symptoms worsen or if the condition fails to improve as anticipated.  I provided 24 minutes of non-face-to-face time during this encounter.      Review of Systems  Constitutional: Negative for diaphoresis and fatigue.  HENT: Negative for congestion and rhinorrhea.   Respiratory: Negative for cough and shortness of breath.   Cardiovascular: Negative for chest pain and leg swelling.  Gastrointestinal: Negative for abdominal pain and diarrhea.  Skin: Negative for color change and rash.  Neurological: Negative for dizziness and headaches.  Psychiatric/Behavioral: Negative for behavioral problems and confusion.       Objective:   Physical Exam  Today's visit was via telephone Physical exam was not possible for this visit       Assessment & Plan:  1. Essential hypertension, benign Blood pressure good control watch salt stay active take medicine  2. Mixed hyperlipidemia Cholesterol decent control no need to repeat lab work previous lab reviewed continue medicine watch diet stay active  3. Prediabetes A1c previous stable watch diet stay active avoid weight gain  Follow-up 6 months for wellness plus chronic health issues

## 2019-10-26 ENCOUNTER — Telehealth: Payer: Self-pay | Admitting: Family Medicine

## 2019-10-26 DIAGNOSIS — R7303 Prediabetes: Secondary | ICD-10-CM

## 2019-10-26 DIAGNOSIS — I10 Essential (primary) hypertension: Secondary | ICD-10-CM

## 2019-10-26 DIAGNOSIS — Z0000 Encounter for general adult medical examination without abnormal findings: Secondary | ICD-10-CM

## 2019-10-26 DIAGNOSIS — Z79899 Other long term (current) drug therapy: Secondary | ICD-10-CM

## 2019-10-26 DIAGNOSIS — E782 Mixed hyperlipidemia: Secondary | ICD-10-CM

## 2019-10-26 DIAGNOSIS — Z125 Encounter for screening for malignant neoplasm of prostate: Secondary | ICD-10-CM

## 2019-10-26 NOTE — Telephone Encounter (Signed)
Last labs 12/07/2018:  Lipid, Liver, Met 7, PSA, HgbA1c 

## 2019-10-26 NOTE — Telephone Encounter (Signed)
Patient needs metabolic 7, lipid, liver, A1c, PSA-hypertension hyperlipidemia prediabetes, wellness exam

## 2019-10-26 NOTE — Telephone Encounter (Signed)
Pt schedule PHY in Sept 9th does pt need blood work?

## 2019-10-27 NOTE — Telephone Encounter (Signed)
Lab orders placed. Left message to return call  

## 2019-11-01 NOTE — Telephone Encounter (Signed)
Left detailed message on voicemail.  

## 2019-12-16 ENCOUNTER — Telehealth: Payer: Self-pay | Admitting: Family Medicine

## 2019-12-16 NOTE — Telephone Encounter (Signed)
Labs were ordered 10/2019 via phone- Patient can have labs done at least one week before the office visit Left message to return call to notify patient

## 2019-12-16 NOTE — Telephone Encounter (Signed)
Pt schedule a Phy for 10/26.  Will he need blood work ordered?  Pt call back [phone]

## 2019-12-19 NOTE — Telephone Encounter (Signed)
Patient notified

## 2019-12-19 NOTE — Telephone Encounter (Signed)
Left message to return call 

## 2019-12-29 ENCOUNTER — Encounter: Payer: Medicare Other | Admitting: Family Medicine

## 2020-01-19 ENCOUNTER — Other Ambulatory Visit: Payer: Self-pay | Admitting: Family Medicine

## 2020-02-07 DIAGNOSIS — Z0000 Encounter for general adult medical examination without abnormal findings: Secondary | ICD-10-CM | POA: Diagnosis not present

## 2020-02-07 DIAGNOSIS — E782 Mixed hyperlipidemia: Secondary | ICD-10-CM | POA: Diagnosis not present

## 2020-02-07 DIAGNOSIS — R7303 Prediabetes: Secondary | ICD-10-CM | POA: Diagnosis not present

## 2020-02-07 DIAGNOSIS — I10 Essential (primary) hypertension: Secondary | ICD-10-CM | POA: Diagnosis not present

## 2020-02-08 LAB — HEPATIC FUNCTION PANEL
ALT: 26 IU/L (ref 0–44)
AST: 25 IU/L (ref 0–40)
Albumin: 4.7 g/dL (ref 3.7–4.7)
Alkaline Phosphatase: 100 IU/L (ref 44–121)
Bilirubin Total: 0.5 mg/dL (ref 0.0–1.2)
Bilirubin, Direct: 0.13 mg/dL (ref 0.00–0.40)
Total Protein: 7.1 g/dL (ref 6.0–8.5)

## 2020-02-08 LAB — LIPID PANEL
Chol/HDL Ratio: 5.3 ratio — ABNORMAL HIGH (ref 0.0–5.0)
Cholesterol, Total: 168 mg/dL (ref 100–199)
HDL: 32 mg/dL — ABNORMAL LOW (ref >39)
LDL Chol Calc (NIH): 81 mg/dL (ref 0–99)
Triglycerides: 336 mg/dL — ABNORMAL HIGH (ref 0–149)
VLDL Cholesterol Cal: 55 mg/dL — ABNORMAL HIGH (ref 5–40)

## 2020-02-08 LAB — BASIC METABOLIC PANEL
BUN/Creatinine Ratio: 16 (ref 10–24)
BUN: 18 mg/dL (ref 8–27)
CO2: 26 mmol/L (ref 20–29)
Calcium: 10.1 mg/dL (ref 8.6–10.2)
Chloride: 103 mmol/L (ref 96–106)
Creatinine, Ser: 1.13 mg/dL (ref 0.76–1.27)
GFR calc Af Amer: 75 mL/min/{1.73_m2} (ref >59)
GFR calc non Af Amer: 65 mL/min/{1.73_m2} (ref >59)
Glucose: 121 mg/dL — ABNORMAL HIGH (ref 65–99)
Potassium: 4.6 mmol/L (ref 3.5–5.2)
Sodium: 141 mmol/L (ref 134–144)

## 2020-02-08 LAB — HEMOGLOBIN A1C
Est. average glucose Bld gHb Est-mCnc: 128 mg/dL
Hgb A1c MFr Bld: 6.1 % — ABNORMAL HIGH (ref 4.8–5.6)

## 2020-02-08 LAB — PSA: Prostate Specific Ag, Serum: 0.6 ng/mL (ref 0.0–4.0)

## 2020-02-14 ENCOUNTER — Encounter: Payer: Self-pay | Admitting: Family Medicine

## 2020-02-14 ENCOUNTER — Ambulatory Visit (INDEPENDENT_AMBULATORY_CARE_PROVIDER_SITE_OTHER): Payer: Medicare Other | Admitting: Family Medicine

## 2020-02-14 ENCOUNTER — Other Ambulatory Visit: Payer: Self-pay

## 2020-02-14 VITALS — BP 144/84 | HR 81 | Temp 98.2°F | Wt 195.0 lb

## 2020-02-14 DIAGNOSIS — R7303 Prediabetes: Secondary | ICD-10-CM

## 2020-02-14 DIAGNOSIS — Z0000 Encounter for general adult medical examination without abnormal findings: Secondary | ICD-10-CM

## 2020-02-14 DIAGNOSIS — I10 Essential (primary) hypertension: Secondary | ICD-10-CM | POA: Diagnosis not present

## 2020-02-14 DIAGNOSIS — E782 Mixed hyperlipidemia: Secondary | ICD-10-CM | POA: Diagnosis not present

## 2020-02-14 DIAGNOSIS — Z125 Encounter for screening for malignant neoplasm of prostate: Secondary | ICD-10-CM

## 2020-02-14 DIAGNOSIS — Z23 Encounter for immunization: Secondary | ICD-10-CM | POA: Diagnosis not present

## 2020-02-14 MED ORDER — PRAVASTATIN SODIUM 20 MG PO TABS: ORAL_TABLET | ORAL | 0 refills | Status: DC

## 2020-02-14 MED ORDER — AMLODIPINE BESYLATE 5 MG PO TABS: ORAL_TABLET | ORAL | 1 refills | Status: DC

## 2020-02-14 NOTE — Progress Notes (Signed)
Subjective:    Patient ID: Stanley Reyes, male    DOB: 11-Apr-1951, 69 y.o.   MRN: [id number]  HPI  AWV- Annual Wellness Visit  The patient was seen for their annual wellness visit. The patient's past medical history, surgical history, and family history were reviewed. Pertinent vaccines were reviewed ( tetanus, pneumonia, shingles, flu) The patient's medication list was reviewed and updated.  The height and weight were entered.  BMI recorded in electronic record elsewhere  Cognitive screening was completed. Outcome of Mini - Cog: PASS   Falls /depression screening electronically recorded within record elsewhere  Current tobacco usage:none (All patients who use tobacco were given written and verbal information on quitting)  Recent listing of emergency department/hospitalizations over the past year were reviewed.  current specialist the patient sees on a regular basis: none   Medicare annual wellness visit patient questionnaire was reviewed.  A written screening schedule for the patient for the next 5-10 years was given. Appropriate discussion of followup regarding next visit was discussed.     Review of Systems  Constitutional: Negative for diaphoresis and fatigue.  HENT: Negative for congestion and rhinorrhea.   Respiratory: Negative for cough and shortness of breath.   Cardiovascular: Negative for chest pain and leg swelling.  Gastrointestinal: Negative for abdominal pain and diarrhea.  Skin: Negative for color change and rash.  Neurological: Negative for dizziness and headaches.  Psychiatric/Behavioral: Negative for behavioral problems and confusion.       Objective:   Physical Exam Constitutional:      Appearance: He is well-developed.  HENT:     Head: Normocephalic and atraumatic.     Right Ear: External ear normal.     Left Ear: External ear normal.     Nose: Nose normal.  Eyes:     Pupils: Pupils are equal, round, and reactive to light.  Neck:       Thyroid: No thyromegaly.  Cardiovascular:     Rate and Rhythm: Normal rate and regular rhythm.     Heart sounds: Normal heart sounds. No murmur heard.   Pulmonary:     Effort: Pulmonary effort is normal. No respiratory distress.     Breath sounds: Normal breath sounds. No wheezing.  Abdominal:     General: Bowel sounds are normal. There is no distension.     Palpations: Abdomen is soft. There is no mass.     Tenderness: There is no abdominal tenderness.  Genitourinary:    Penis: Normal.   Musculoskeletal:        General: Normal range of motion.     Cervical back: Normal range of motion and neck supple.  Lymphadenopathy:     Cervical: No cervical adenopathy.  Skin:    General: Skin is warm and dry.     Findings: No erythema.  Neurological:     Mental Status: He is alert.     Motor: No abnormal muscle tone.  Psychiatric:        Behavior: Behavior normal.        Judgment: Judgment normal.   Prostate exam normal         Assessment & Plan:  1. Need for vaccination Flu shot today - Flu Vaccine QUAD High Dose(Fluad)  2. Essential hypertension, benign Blood pressure elevated on several readings patient had to wait and inordinate amount of time this could have affected his blood pressure.  He will follow-up for free recheck of blood pressure by myself either next week or the next  1 he will drop by near lunchtime  3. Mixed hyperlipidemia Cholesterol under decent control with medication continue medicine  4. Prediabetes Prediabetes dietary is the best approach currently no need to add medications currently recheck this again in 6 months  5. Encounter for subsequent annual wellness visit (AWV) in Medicare patient Adult wellness-complete.wellness physical was conducted today. Importance of diet and exercise were discussed in detail.  In addition to this a discussion regarding safety was also covered. We also reviewed over immunizations and gave recommendations regarding  current immunization needed for age.  In addition to this additional areas were also touched on including: Preventative health exams needed:  Colonoscopy up-to-date on colonoscopy next 1 is in 2 years  Patient was advised yearly wellness exam No major interventions necessary Vaccines discussed today. Medications updated today.

## 2020-02-14 NOTE — Patient Instructions (Signed)
Results for orders placed or performed in visit on 60/45/40  Basic Metabolic Panel (BMET)  Result Value Ref Range   Glucose 121 (H) 65 - 99 mg/dL   BUN 18 8 - 27 mg/dL   Creatinine, Ser 1.13 0.76 - 1.27 mg/dL   GFR calc non Af Amer 65 >59 mL/min/1.73   GFR calc Af Amer 75 >59 mL/min/1.73   BUN/Creatinine Ratio 16 10 - 24   Sodium 141 134 - 144 mmol/L   Potassium 4.6 3.5 - 5.2 mmol/L   Chloride 103 96 - 106 mmol/L   CO2 26 20 - 29 mmol/L   Calcium 10.1 8.6 - 10.2 mg/dL  Lipid Profile  Result Value Ref Range   Cholesterol, Total 168 100 - 199 mg/dL   Triglycerides 336 (H) 0 - 149 mg/dL   HDL 32 (L) >39 mg/dL   VLDL Cholesterol Cal 55 (H) 5 - 40 mg/dL   LDL Chol Calc (NIH) 81 0 - 99 mg/dL   Chol/HDL Ratio 5.3 (H) 0.0 - 5.0 ratio  Hepatic function panel  Result Value Ref Range   Total Protein 7.1 6.0 - 8.5 g/dL   Albumin 4.7 3.7 - 4.7 g/dL   Bilirubin Total 0.5 0.0 - 1.2 mg/dL   Bilirubin, Direct 0.13 0.00 - 0.40 mg/dL   Alkaline Phosphatase 100 44 - 121 IU/L   AST 25 0 - 40 IU/L   ALT 26 0 - 44 IU/L  Hemoglobin A1c  Result Value Ref Range   Hgb A1c MFr Bld 6.1 (H) 4.8 - 5.6 %   Est. average glucose Bld gHb Est-mCnc 128 mg/dL  PSA  Result Value Ref Range   Prostate Specific Ag, Serum 0.6 0.0 - 4.0 ng/mL      DASH Eating Plan DASH stands for "Dietary Approaches to Stop Hypertension." The DASH eating plan is a healthy eating plan that has been shown to reduce high blood pressure (hypertension). It may also reduce your risk for type 2 diabetes, heart disease, and stroke. The DASH eating plan may also help with weight loss. What are tips for following this plan?  General guidelines  Avoid eating more than 2,300 mg (milligrams) of salt (sodium) a day. If you have hypertension, you may need to reduce your sodium intake to 1,500 mg a day.  Limit alcohol intake to no more than 1 drink a day for nonpregnant women and 2 drinks a day for men. One drink equals 12 oz of beer, 5 oz of  wine, or 1 oz of hard liquor.  Work with your health care provider to maintain a healthy body weight or to lose weight. Ask what an ideal weight is for you.  Get at least 30 minutes of exercise that causes your heart to beat faster (aerobic exercise) most days of the week. Activities may include walking, swimming, or biking.  Work with your health care provider or diet and nutrition specialist (dietitian) to adjust your eating plan to your individual calorie needs. Reading food labels   Check food labels for the amount of sodium per serving. Choose foods with less than 5 percent of the Daily Value of sodium. Generally, foods with less than 300 mg of sodium per serving fit into this eating plan.  To find whole grains, look for the word "whole" as the first word in the ingredient list. Shopping  Buy products labeled as "low-sodium" or "no salt added."  Buy fresh foods. Avoid canned foods and premade or frozen meals. Cooking  Avoid adding  salt when cooking. Use salt-free seasonings or herbs instead of table salt or sea salt. Check with your health care provider or pharmacist before using salt substitutes.  Do not fry foods. Cook foods using healthy methods such as baking, boiling, grilling, and broiling instead.  Cook with heart-healthy oils, such as olive, canola, soybean, or sunflower oil. Meal planning  Eat a balanced diet that includes: ? 5 or more servings of fruits and vegetables each day. At each meal, try to fill half of your plate with fruits and vegetables. ? Up to 6-8 servings of whole grains each day. ? Less than 6 oz of lean meat, poultry, or fish each day. A 3-oz serving of meat is about the same size as a deck of cards. One egg equals 1 oz. ? 2 servings of low-fat dairy each day. ? A serving of nuts, seeds, or beans 5 times each week. ? Heart-healthy fats. Healthy fats called Omega-3 fatty acids are found in foods such as flaxseeds and coldwater fish, like sardines,  salmon, and mackerel.  Limit how much you eat of the following: ? Canned or prepackaged foods. ? Food that is high in trans fat, such as fried foods. ? Food that is high in saturated fat, such as fatty meat. ? Sweets, desserts, sugary drinks, and other foods with added sugar. ? Full-fat dairy products.  Do not salt foods before eating.  Try to eat at least 2 vegetarian meals each week.  Eat more home-cooked food and less restaurant, buffet, and fast food.  When eating at a restaurant, ask that your food be prepared with less salt or no salt, if possible. What foods are recommended? The items listed may not be a complete list. Talk with your dietitian about what dietary choices are best for you. Grains Whole-grain or whole-wheat bread. Whole-grain or whole-wheat pasta. Brown rice. [place]. Bulgur. Whole-grain and low-sodium cereals. Pita bread. Low-fat, low-sodium crackers. Whole-wheat flour tortillas. Vegetables Fresh or frozen vegetables (raw, steamed, roasted, or grilled). Low-sodium or reduced-sodium tomato and vegetable juice. Low-sodium or reduced-sodium tomato sauce and tomato paste. Low-sodium or reduced-sodium canned vegetables. Fruits All fresh, dried, or frozen fruit. Canned fruit in natural juice (without added sugar). Meat and other protein foods Skinless chicken or [place]. Ground chicken or [place]. Pork with fat trimmed off. Fish and seafood. Egg whites. Dried beans, peas, or lentils. Unsalted nuts, nut butters, and seeds. Unsalted canned beans. Lean cuts of beef with fat trimmed off. Low-sodium, lean deli meat. Dairy Low-fat (1%) or fat-free (skim) milk. Fat-free, low-fat, or reduced-fat cheeses. Nonfat, low-sodium ricotta or cottage cheese. Low-fat or nonfat yogurt. Low-fat, low-sodium cheese. Fats and oils Soft margarine without trans fats. Vegetable oil. Low-fat, reduced-fat, or light mayonnaise and salad dressings (reduced-sodium). Canola, safflower, olive,  soybean, and sunflower oils. Avocado. Seasoning and other foods Herbs. Spices. Seasoning mixes without salt. Unsalted popcorn and pretzels. Fat-free sweets. What foods are not recommended? The items listed may not be a complete list. Talk with your dietitian about what dietary choices are best for you. Grains Baked goods made with fat, such as croissants, muffins, or some breads. Dry pasta or rice meal packs. Vegetables Creamed or fried vegetables. Vegetables in a cheese sauce. Regular canned vegetables (not low-sodium or reduced-sodium). Regular canned tomato sauce and paste (not low-sodium or reduced-sodium). Regular tomato and vegetable juice (not low-sodium or reduced-sodium). Angie Fava. Olives. Fruits Canned fruit in a light or heavy syrup. Fried fruit. Fruit in cream or butter sauce. Meat and other  protein foods Fatty cuts of meat. Ribs. Fried meat. Berniece Salines. Sausage. Bologna and other processed lunch meats. Salami. Fatback. Hotdogs. Bratwurst. Salted nuts and seeds. Canned beans with added salt. Canned or smoked fish. Whole eggs or egg yolks. Chicken or [place] with skin. Dairy Whole or 2% milk, cream, and half-and-half. Whole or full-fat cream cheese. Whole-fat or sweetened yogurt. Full-fat cheese. Nondairy creamers. Whipped toppings. Processed cheese and cheese spreads. Fats and oils Butter. Stick margarine. Lard. Shortening. Ghee. Bacon fat. Tropical oils, such as coconut, palm kernel, or palm oil. Seasoning and other foods Salted popcorn and pretzels. Onion salt, garlic salt, seasoned salt, table salt, and sea salt. Worcestershire sauce. Tartar sauce. Barbecue sauce. Teriyaki sauce. Soy sauce, including reduced-sodium. Steak sauce. Canned and packaged gravies. Fish sauce. Oyster sauce. Cocktail sauce. Horseradish that you find on the shelf. Ketchup. Mustard. Meat flavorings and tenderizers. Bouillon cubes. Hot sauce and Tabasco sauce. Premade or packaged marinades. Premade or packaged taco  seasonings. Relishes. Regular salad dressings. Where to find more information:  National Heart, Lung, and [place]: https://wilson-eaton.com/  American Heart Association: www.heart.org Summary  The DASH eating plan is a healthy eating plan that has been shown to reduce high blood pressure (hypertension). It may also reduce your risk for type 2 diabetes, heart disease, and stroke.  With the DASH eating plan, you should limit salt (sodium) intake to 2,300 mg a day. If you have hypertension, you may need to reduce your sodium intake to 1,500 mg a day.  When on the DASH eating plan, aim to eat more fresh fruits and vegetables, whole grains, lean proteins, low-fat dairy, and heart-healthy fats.  Work with your health care provider or diet and nutrition specialist (dietitian) to adjust your eating plan to your individual calorie needs. This information is not intended to replace advice given to you by your health care provider. Make sure you discuss any questions you have with your health care provider. Document Revised: 03/20/2017 Document Reviewed: 03/31/2016 Elsevier Patient Education  [place]. Diabetes Mellitus and Nutrition, Adult When you have diabetes (diabetes mellitus), it is very important to have healthy eating habits because your blood sugar (glucose) levels are greatly affected by what you eat and drink. Eating healthy foods in the appropriate amounts, at about the same times every day, can help you:  Control your blood glucose.  Lower your risk of heart disease.  Improve your blood pressure.  Reach or maintain a healthy weight. Every person with diabetes is different, and each person has different needs for a meal plan. Your health care provider may recommend that you work with a diet and nutrition specialist (dietitian) to make a meal plan that is best for you. Your meal plan may vary depending on factors such as:  The calories you need.  The medicines you  take.  Your weight.  Your blood glucose, blood pressure, and cholesterol levels.  Your activity level.  Other health conditions you have, such as heart or kidney disease. How do carbohydrates affect me? Carbohydrates, also called carbs, affect your blood glucose level more than any other type of food. Eating carbs naturally raises the amount of glucose in your blood. Carb counting is a method for keeping track of how many carbs you eat. Counting carbs is important to keep your blood glucose at a healthy level, especially if you use insulin or take certain oral diabetes medicines. It is important to know how many carbs you can safely have in each meal. This is  different for every person. Your dietitian can help you calculate how many carbs you should have at each meal and for each snack. Foods that contain carbs include:  Bread, cereal, rice, pasta, and crackers.  Potatoes and corn.  Peas, beans, and lentils.  Milk and yogurt.  Fruit and juice.  Desserts, such as cakes, cookies, ice cream, and candy. How does alcohol affect me? Alcohol can cause a sudden decrease in blood glucose (hypoglycemia), especially if you use insulin or take certain oral diabetes medicines. Hypoglycemia can be a life-threatening condition. Symptoms of hypoglycemia (sleepiness, dizziness, and confusion) are similar to symptoms of having too much alcohol. If your health care provider says that alcohol is safe for you, follow these guidelines:  Limit alcohol intake to no more than 1 drink per day for nonpregnant women and 2 drinks per day for men. One drink equals 12 oz of beer, 5 oz of wine, or 1 oz of hard liquor.  Do not drink on an empty stomach.  Keep yourself hydrated with water, diet soda, or unsweetened iced tea.  Keep in mind that regular soda, juice, and other mixers may contain a lot of sugar and must be counted as carbs. What are tips for following this plan?  Reading food labels  Start by  checking the serving size on the "Nutrition Facts" label of packaged foods and drinks. The amount of calories, carbs, fats, and other nutrients listed on the label is based on one serving of the item. Many items contain more than one serving per package.  Check the total grams (g) of carbs in one serving. You can calculate the number of servings of carbs in one serving by dividing the total carbs by 15. For example, if a food has 30 g of total carbs, it would be equal to 2 servings of carbs.  Check the number of grams (g) of saturated and trans fats in one serving. Choose foods that have low or no amount of these fats.  Check the number of milligrams (mg) of salt (sodium) in one serving. Most people should limit total sodium intake to less than 2,300 mg per day.  Always check the nutrition information of foods labeled as "low-fat" or "nonfat". These foods may be higher in added sugar or refined carbs and should be avoided.  Talk to your dietitian to identify your daily goals for nutrients listed on the label. Shopping  Avoid buying canned, premade, or processed foods. These foods tend to be high in fat, sodium, and added sugar.  Shop around the outside edge of the grocery store. This includes fresh fruits and vegetables, bulk grains, fresh meats, and fresh dairy. Cooking  Use low-heat cooking methods, such as baking, instead of high-heat cooking methods like deep frying.  Cook using healthy oils, such as olive, canola, or sunflower oil.  Avoid cooking with butter, cream, or high-fat meats. Meal planning  Eat meals and snacks regularly, preferably at the same times every day. Avoid going long periods of time without eating.  Eat foods high in fiber, such as fresh fruits, vegetables, beans, and whole grains. Talk to your dietitian about how many servings of carbs you can eat at each meal.  Eat 4-6 ounces (oz) of lean protein each day, such as lean meat, chicken, fish, eggs, or tofu. One oz  of lean protein is equal to: ? 1 oz of meat, chicken, or fish. ? 1 egg. ?  cup of tofu.  Eat some foods each day that  contain healthy fats, such as avocado, nuts, seeds, and fish. Lifestyle  Check your blood glucose regularly.  Exercise regularly as told by your health care provider. This may include: ? 150 minutes of moderate-intensity or vigorous-intensity exercise each week. This could be brisk walking, biking, or water aerobics. ? Stretching and doing strength exercises, such as yoga or weightlifting, at least 2 times a week.  Take medicines as told by your health care provider.  Do not use any products that contain nicotine or tobacco, such as cigarettes and e-cigarettes. If you need help quitting, ask your health care provider.  Work with a Social worker or diabetes educator to identify strategies to manage stress and any emotional and social challenges. Questions to ask a health care provider  Do I need to meet with a diabetes educator?  Do I need to meet with a dietitian?  What number can I call if I have questions?  When are the best times to check my blood glucose? Where to find more information:  American Diabetes Association: diabetes.org  Academy of Nutrition and Dietetics: www.eatright.[employer] of Diabetes and Digestive and Kidney Diseases (NIH): [employer] Summary  A healthy meal plan will help you control your blood glucose and maintain a healthy lifestyle.  Working with a diet and nutrition specialist (dietitian) can help you make a meal plan that is best for you.  Keep in mind that carbohydrates (carbs) and alcohol have immediate effects on your blood glucose levels. It is important to count carbs and to use alcohol carefully. This information is not intended to replace advice given to you by your health care provider. Make sure you discuss any questions you have with your health care provider. Document Revised: 03/20/2017 Document  Reviewed: 05/12/2016 Elsevier Patient Education  2020 [employer].

## 2020-02-28 ENCOUNTER — Ambulatory Visit (INDEPENDENT_AMBULATORY_CARE_PROVIDER_SITE_OTHER): Payer: Medicare Other | Admitting: Family Medicine

## 2020-02-28 ENCOUNTER — Other Ambulatory Visit: Payer: Self-pay

## 2020-02-28 VITALS — BP 122/76

## 2020-02-28 DIAGNOSIS — I10 Essential (primary) hypertension: Secondary | ICD-10-CM

## 2020-02-28 NOTE — Progress Notes (Signed)
Patient in today for blood pressure check blood pressure check was completed and very good at 122/76 follow-up for next regular checkup no charge for today

## 2020-06-07 DIAGNOSIS — H524 Presbyopia: Secondary | ICD-10-CM | POA: Diagnosis not present

## 2020-07-18 ENCOUNTER — Other Ambulatory Visit: Payer: Self-pay | Admitting: Family Medicine

## 2020-07-18 NOTE — Telephone Encounter (Signed)
Needs follow-up by spring time 1 refill

## 2020-09-03 ENCOUNTER — Other Ambulatory Visit: Payer: Self-pay | Admitting: Family Medicine

## 2020-09-03 NOTE — Telephone Encounter (Signed)
May have 90-day needs follow-up this summer 

## 2020-10-19 ENCOUNTER — Other Ambulatory Visit: Payer: Self-pay | Admitting: Family Medicine

## 2020-10-19 NOTE — Telephone Encounter (Signed)
To schedule follow-up visit.  Lab may have 1 refill 30 with 1 additional refill

## 2020-11-20 ENCOUNTER — Ambulatory Visit (INDEPENDENT_AMBULATORY_CARE_PROVIDER_SITE_OTHER): Payer: Medicare Other | Admitting: Family Medicine

## 2020-11-20 ENCOUNTER — Encounter: Payer: Self-pay | Admitting: Family Medicine

## 2020-11-20 ENCOUNTER — Other Ambulatory Visit: Payer: Self-pay

## 2020-11-20 VITALS — BP 138/78 | HR 71 | Temp 97.6°F | Ht 69.0 in | Wt 191.0 lb

## 2020-11-20 DIAGNOSIS — I10 Essential (primary) hypertension: Secondary | ICD-10-CM

## 2020-11-20 DIAGNOSIS — E782 Mixed hyperlipidemia: Secondary | ICD-10-CM | POA: Diagnosis not present

## 2020-11-20 DIAGNOSIS — Z125 Encounter for screening for malignant neoplasm of prostate: Secondary | ICD-10-CM | POA: Diagnosis not present

## 2020-11-20 MED ORDER — AMLODIPINE BESYLATE 5 MG PO TABS: 5.0000 mg | ORAL_TABLET | Freq: Every day | ORAL | 1 refills | Status: DC

## 2020-11-20 MED ORDER — PRAVASTATIN SODIUM 20 MG PO TABS: 20.0000 mg | ORAL_TABLET | Freq: Every day | ORAL | 1 refills | Status: DC

## 2020-11-20 NOTE — Progress Notes (Signed)
   Subjective:    Patient ID: Stanley Reyes, male    DOB: 04/02/1951, 70 y.o.   MRN: [id number]  Hypertension Patient for blood pressure check up.  The patient does have hypertension.  The patient is on medication.  Patient relates compliance with meds. Todays BP reviewed with the patient. Patient denies issues with medication. Patient relates reasonable diet. Patient tries to minimize salt. Patient aware of BP goals.  Patient here for follow-up regarding cholesterol.  The patient does have hyperlipidemia.  Patient does try to maintain a reasonable diet.  Patient does take the medication on a regular basis.  Denies missing a dose.  The patient denies any obvious side effects.  Prior blood work results reviewed with the patient.  The patient is aware of his cholesterol goals and the need to keep it under good control to lessen the risk of disease.   Prediabetes noted   Review of Systems     Objective:   Physical Exam General-in no acute distress Eyes-no discharge Lungs-respiratory rate normal, CTA CV-no murmurs,RRR Extremities skin warm dry no edema Neuro grossly normal Behavior normal, alert        Assessment & Plan:  Healthy diet discussed HTN- patient seen for follow-up regarding HTN.  Diet, medication compliance, appropriate labs and refills were completed.  Importance of keeping blood pressure under good control to lessen the risk of complications discussed  Hyperlipidemia-importance of diet, weight control, activity, compliance with medications discussed.  Recent labs reviewed.  Any additional labs or refills ordered.  Importance of keeping under good control discussed.  Wellness later this year and labs

## 2021-02-05 DIAGNOSIS — E782 Mixed hyperlipidemia: Secondary | ICD-10-CM | POA: Diagnosis not present

## 2021-02-05 DIAGNOSIS — Z125 Encounter for screening for malignant neoplasm of prostate: Secondary | ICD-10-CM | POA: Diagnosis not present

## 2021-02-05 DIAGNOSIS — I10 Essential (primary) hypertension: Secondary | ICD-10-CM | POA: Diagnosis not present

## 2021-02-06 LAB — BASIC METABOLIC PANEL
BUN/Creatinine Ratio: 15 (ref 10–24)
BUN: 17 mg/dL (ref 8–27)
CO2: 23 mmol/L (ref 20–29)
Calcium: 10 mg/dL (ref 8.6–10.2)
Chloride: 104 mmol/L (ref 96–106)
Creatinine, Ser: 1.12 mg/dL (ref 0.76–1.27)
Glucose: 127 mg/dL — ABNORMAL HIGH (ref 70–99)
Potassium: 4.4 mmol/L (ref 3.5–5.2)
Sodium: 142 mmol/L (ref 134–144)
eGFR: 70 mL/min/{1.73_m2} (ref >59)

## 2021-02-06 LAB — HEPATIC FUNCTION PANEL
ALT: 25 IU/L (ref 0–44)
AST: 24 IU/L (ref 0–40)
Albumin: 4.8 g/dL — ABNORMAL HIGH (ref 3.7–4.7)
Alkaline Phosphatase: 95 IU/L (ref 44–121)
Bilirubin Total: 0.8 mg/dL (ref 0.0–1.2)
Bilirubin, Direct: 0.18 mg/dL (ref 0.00–0.40)
Total Protein: 7.2 g/dL (ref 6.0–8.5)

## 2021-02-06 LAB — PSA: Prostate Specific Ag, Serum: 0.5 ng/mL (ref 0.0–4.0)

## 2021-02-06 LAB — LIPID PANEL
Chol/HDL Ratio: 4.2 ratio (ref 0.0–5.0)
Cholesterol, Total: 163 mg/dL (ref 100–199)
HDL: 39 mg/dL — ABNORMAL LOW (ref >39)
LDL Chol Calc (NIH): 95 mg/dL (ref 0–99)
Triglycerides: 165 mg/dL — ABNORMAL HIGH (ref 0–149)
VLDL Cholesterol Cal: 29 mg/dL (ref 5–40)

## 2021-02-14 ENCOUNTER — Ambulatory Visit (INDEPENDENT_AMBULATORY_CARE_PROVIDER_SITE_OTHER): Payer: Medicare Other | Admitting: Family Medicine

## 2021-02-14 ENCOUNTER — Other Ambulatory Visit: Payer: Self-pay

## 2021-02-14 VITALS — BP 129/72 | HR 86 | Temp 97.5°F | Ht 69.0 in | Wt 192.0 lb

## 2021-02-14 DIAGNOSIS — Z0000 Encounter for general adult medical examination without abnormal findings: Secondary | ICD-10-CM | POA: Diagnosis not present

## 2021-02-14 DIAGNOSIS — R7301 Impaired fasting glucose: Secondary | ICD-10-CM

## 2021-02-14 DIAGNOSIS — I10 Essential (primary) hypertension: Secondary | ICD-10-CM

## 2021-02-14 NOTE — Patient Instructions (Addendum)
Shingrix and shingles prevention: know the facts!   Shingrix is a very effective vaccine to prevent shingles.   Shingles is a reactivation of chickenpox -more than 99% of Americans born before 1980 have had chickenpox even if they do not remember it. One in every 10 people who get shingles have severe long-lasting nerve pain as a result.   33 out of a 100 older adults will get shingles if they are unvaccinated.     This vaccine is very important for your health This vaccine is indicated for anyone 50 years or older. You can get this vaccine even if you have already had shingles because you can get the disease more than once in a lifetime.  Your risk for shingles and its complications increases with age.  This vaccine has 2 doses.  The second dose would be 2 to 6 months after the first dose.  If you had Zostavax vaccine in the past you should still get Shingrix. ( Zostavax is only 70% effective and it loses significant strength over a few years .)  This vaccine is given through the pharmacy.  The cost of the vaccine is through your insurance. The pharmacy can inform you of the total costs.  Common side effects including soreness in the arm, some redness and swelling, also some feel fatigue muscle soreness headache low-grade fever.  Side effects typically go away within 2 to 3 days. Remember-the pain from shingles can last a lifetime but these side effects of the vaccine will only last a few days at most. It is very important to get both doses in order to protect yourself fully.   Please get this vaccine at your earliest convenience at your trusted pharmacy.        Diabetes Mellitus and Nutrition, Adult When you have diabetes, or diabetes mellitus, it is very important to have healthy eating habits because your blood sugar (glucose) levels are greatly affected by what you eat and drink. Eating healthy foods in the right amounts, at about the same times every day, can help  you: Control your blood glucose. Lower your risk of heart disease. Improve your blood pressure. Reach or maintain a healthy weight. What can affect my meal plan? Every person with diabetes is different, and each person has different needs for a meal plan. Your health care provider may recommend that you work with a dietitian to make a meal plan that is best for you. Your meal plan may vary depending on factors such as: The calories you need. The medicines you take. Your weight. Your blood glucose, blood pressure, and cholesterol levels. Your activity level. Other health conditions you have, such as heart or kidney disease. How do carbohydrates affect me? Carbohydrates, also called carbs, affect your blood glucose level more than any other type of food. Eating carbs naturally raises the amount of glucose in your blood. Carb counting is a method for keeping track of how many carbs you eat. Counting carbs is important to keep your blood glucose at a healthy level, especially if you use insulin or take certain oral diabetes medicines. It is important to know how many carbs you can safely have in each meal. This is different for every person. Your dietitian can help you calculate how many carbs you should have at each meal and for each snack. How does alcohol affect me? Alcohol can cause a sudden decrease in blood glucose (hypoglycemia), especially if you use insulin or take certain oral diabetes medicines. Hypoglycemia can be  a life-threatening condition. Symptoms of hypoglycemia, such as sleepiness, dizziness, and confusion, are similar to symptoms of having too much alcohol. Do not drink alcohol if: Your health care provider tells you not to drink. You are pregnant, may be pregnant, or are planning to become pregnant. If you drink alcohol: Do not drink on an empty stomach. Limit how much you use to: 0-1 drink a day for women. 0-2 drinks a day for men. Be aware of how much alcohol is in your  drink. In the [place] one drink equals one 12 oz bottle of beer (355 mL), one 5 oz glass of wine (148 mL), or one 1 oz glass of hard liquor (44 mL). Keep yourself hydrated with water, diet soda, or unsweetened iced tea. Keep in mind that regular soda, juice, and other mixers may contain a lot of sugar and must be counted as carbs. What are tips for following this plan? Reading food labels Start by checking the serving size on the "Nutrition Facts" label of packaged foods and drinks. The amount of calories, carbs, fats, and other nutrients listed on the label is based on one serving of the item. Many items contain more than one serving per package. Check the total grams (g) of carbs in one serving. You can calculate the number of servings of carbs in one serving by dividing the total carbs by 15. For example, if a food has 30 g of total carbs per serving, it would be equal to 2 servings of carbs. Check the number of grams (g) of saturated fats and trans fats in one serving. Choose foods that have a low amount or none of these fats. Check the number of milligrams (mg) of salt (sodium) in one serving. Most people should limit total sodium intake to less than 2,300 mg per day. Always check the nutrition information of foods labeled as "low-fat" or "nonfat." These foods may be higher in added sugar or refined carbs and should be avoided. Talk to your dietitian to identify your daily goals for nutrients listed on the label. Shopping Avoid buying canned, pre-made, or processed foods. These foods tend to be high in fat, sodium, and added sugar. Shop around the outside edge of the grocery store. This is where you will most often find fresh fruits and vegetables, bulk grains, fresh meats, and fresh dairy. Cooking Use low-heat cooking methods, such as baking, instead of high-heat cooking methods like deep frying. Cook using healthy oils, such as olive, canola, or sunflower oil. Avoid cooking with butter, cream,  or high-fat meats. Meal planning Eat meals and snacks regularly, preferably at the same times every day. Avoid going long periods of time without eating. Eat foods that are high in fiber, such as fresh fruits, vegetables, beans, and whole grains. Talk with your dietitian about how many servings of carbs you can eat at each meal. Eat 4-6 oz (112-168 g) of lean protein each day, such as lean meat, chicken, fish, eggs, or tofu. One ounce (oz) of lean protein is equal to: 1 oz (28 g) of meat, chicken, or fish. 1 egg.  cup (62 g) of tofu. Eat some foods each day that contain healthy fats, such as avocado, nuts, seeds, and fish. What foods should I eat? Fruits Berries. Apples. Oranges. Peaches. Apricots. Plums. Grapes. Mango. Papaya. Pomegranate. Kiwi. Cherries. Vegetables Lettuce. Spinach. Leafy greens, including kale, chard, collard greens, and mustard greens. Beets. Cauliflower. Cabbage. Broccoli. Carrots. Green beans. Tomatoes. Peppers. Onions. Cucumbers. Brussels sprouts. Grains Whole grains,  such as whole-wheat or whole-grain bread, crackers, tortillas, cereal, and pasta. Unsweetened oatmeal. Quinoa. Brown or wild rice. Meats and other proteins Seafood. Poultry without skin. Lean cuts of poultry and beef. Tofu. Nuts. Seeds. Dairy Low-fat or fat-free dairy products such as milk, yogurt, and cheese. The items listed above may not be a complete list of foods and beverages you can eat. Contact a dietitian for more information. What foods should I avoid? Fruits Fruits canned with syrup. Vegetables Canned vegetables. Frozen vegetables with butter or cream sauce. Grains Refined white flour and flour products such as bread, pasta, snack foods, and cereals. Avoid all processed foods. Meats and other proteins Fatty cuts of meat. Poultry with skin. Breaded or fried meats. Processed meat. Avoid saturated fats. Dairy Full-fat yogurt, cheese, or milk. Beverages Sweetened drinks, such as soda or  iced tea. The items listed above may not be a complete list of foods and beverages you should avoid. Contact a dietitian for more information. Questions to ask a health care provider Do I need to meet with a diabetes educator? Do I need to meet with a dietitian? What number can I call if I have questions? When are the best times to check my blood glucose? Where to find more information: American Diabetes Association: diabetes.org Academy of Nutrition and Dietetics: www.eatright.[employer] of Diabetes and Digestive and Kidney Diseases: [employer] Association of Diabetes Care and Education Specialists: www.diabeteseducator.org Summary It is important to have healthy eating habits because your blood sugar (glucose) levels are greatly affected by what you eat and drink. A healthy meal plan will help you control your blood glucose and maintain a healthy lifestyle. Your health care provider may recommend that you work with a dietitian to make a meal plan that is best for you. Keep in mind that carbohydrates (carbs) and alcohol have immediate effects on your blood glucose levels. It is important to count carbs and to use alcohol carefully. This information is not intended to replace advice given to you by your health care provider. Make sure you discuss any questions you have with your health care provider. Document Revised: 03/15/2019 Document Reviewed: 03/15/2019 Elsevier Patient Education  2021 [place].  Results for orders placed or performed in visit on 11/20/20  PSA  Result Value Ref Range   Prostate Specific Ag, Serum 0.5 0.0 - 4.0 ng/mL  Lipid panel  Result Value Ref Range   Cholesterol, Total 163 100 - 199 mg/dL   Triglycerides 165 (H) 0 - 149 mg/dL   HDL 39 (L) >39 mg/dL   VLDL Cholesterol Cal 29 5 - 40 mg/dL   LDL Chol Calc (NIH) 95 0 - 99 mg/dL   Chol/HDL Ratio 4.2 0.0 - 5.0 ratio  Hepatic function panel  Result Value Ref Range   Total Protein 7.2 6.0 -  8.5 g/dL   Albumin 4.8 (H) 3.7 - 4.7 g/dL   Bilirubin Total 0.8 0.0 - 1.2 mg/dL   Bilirubin, Direct 0.18 0.00 - 0.40 mg/dL   Alkaline Phosphatase 95 44 - 121 IU/L   AST 24 0 - 40 IU/L   ALT 25 0 - 44 IU/L  Basic metabolic panel  Result Value Ref Range   Glucose 127 (H) 70 - 99 mg/dL   BUN 17 8 - 27 mg/dL   Creatinine, Ser 1.12 0.76 - 1.27 mg/dL   eGFR 70 >59 mL/min/1.73   BUN/Creatinine Ratio 15 10 - 24   Sodium 142 134 - 144 mmol/L   Potassium 4.4 3.5 -  5.2 mmol/L   Chloride 104 96 - 106 mmol/L   CO2 23 20 - 29 mmol/L   Calcium 10.0 8.6 - 10.2 mg/dL

## 2021-02-14 NOTE — Progress Notes (Signed)
   Subjective:    Patient ID: Stanley Reyes, male    DOB: 03-29-1951, 70 y.o.   MRN: [id number]  HPI AWV- Annual Wellness Visit  The patient was seen for their annual wellness visit. The patient's past medical history, surgical history, and family history were reviewed. Pertinent vaccines were reviewed ( tetanus, pneumonia, shingles, flu) The patient's medication list was reviewed and updated.  The height and weight were entered.  BMI recorded in electronic record elsewhere  Cognitive screening was completed. Outcome of Mini - Cog: 5   Falls /depression screening electronically recorded within record elsewhere  Current tobacco usage:no (All patients who use tobacco were given written and verbal information on quitting)  Recent listing of emergency department/hospitalizations over the past year were reviewed.  current specialist the patient sees on a regular basis:    Medicare annual wellness visit patient questionnaire was reviewed.  A written screening schedule for the patient for the next 5-10 years was given. Appropriate discussion of followup regarding next visit was discussed.      Review of Systems     Objective:   Physical Exam General-in no acute distress Eyes-no discharge Lungs-respiratory rate normal, CTA CV-no murmurs,RRR Extremities skin warm dry no edema Neuro grossly normal Behavior normal, alert        Assessment & Plan:  .[facility] Adult wellness-complete.wellness physical was conducted today. Importance of diet and exercise were discussed in detail.  In addition to this a discussion regarding safety was also covered. We also reviewed over immunizations and gave recommendations regarding current immunization needed for age.  In addition to this additional areas were also touched on including: Preventative health exams needed:  Colonoscopy 2024  Patient was advised yearly wellness exam  Blood pressure good control continue current  measures refills given  Hyperlipidemia good control continue current measures  Hyperglycemia history of prediabetes minimize starches fit in walking repeat lab work in 3 months may need medication depending on results follow-up in the springtime

## 2021-05-28 ENCOUNTER — Other Ambulatory Visit: Payer: Self-pay | Admitting: Family Medicine

## 2021-06-18 ENCOUNTER — Other Ambulatory Visit: Payer: Self-pay | Admitting: Family Medicine

## 2021-08-14 ENCOUNTER — Encounter: Payer: Self-pay | Admitting: Family Medicine

## 2021-08-14 ENCOUNTER — Ambulatory Visit (INDEPENDENT_AMBULATORY_CARE_PROVIDER_SITE_OTHER): Payer: Medicare Other | Admitting: Family Medicine

## 2021-08-14 VITALS — BP 132/82 | HR 62 | Temp 98.5°F | Wt 189.8 lb

## 2021-08-14 DIAGNOSIS — I10 Essential (primary) hypertension: Secondary | ICD-10-CM | POA: Diagnosis not present

## 2021-08-14 DIAGNOSIS — R7303 Prediabetes: Secondary | ICD-10-CM | POA: Diagnosis not present

## 2021-08-14 DIAGNOSIS — R7301 Impaired fasting glucose: Secondary | ICD-10-CM | POA: Diagnosis not present

## 2021-08-14 DIAGNOSIS — E782 Mixed hyperlipidemia: Secondary | ICD-10-CM

## 2021-08-14 MED ORDER — AMLODIPINE BESYLATE 5 MG PO TABS
ORAL_TABLET | ORAL | 1 refills | Status: DC
Start: 2021-08-14 — End: 2022-04-02

## 2021-08-14 MED ORDER — PRAVASTATIN SODIUM 20 MG PO TABS: ORAL_TABLET | ORAL | 1 refills | Status: DC

## 2021-08-14 NOTE — Patient Instructions (Addendum)
Shingrix and shingles prevention: know the facts! ? ? ?Shingrix is a very effective vaccine to prevent shingles.   ?Shingles is a reactivation of chickenpox -more than 99% of Americans born before 1980 have had chickenpox even if they do not remember it. ?One in every 10 people who get shingles have severe long-lasting nerve pain as a result.   ?33 out of a 100 older adults will get shingles if they are unvaccinated.   ? ? ?This vaccine is very important for your health ?This vaccine is indicated for anyone 50 years or older. ?You can get this vaccine even if you have already had shingles because you can get the disease more than once in a lifetime. ? ?Your risk for shingles and its complications increases with age. ? ?This vaccine has 2 doses.  The second dose would be 2 to 6 months after the first dose.  ?If you had Zostavax vaccine in the past you should still get Shingrix. ?( Zostavax is only 70% effective and it loses significant strength over a few years .) ? ?This vaccine is given through the pharmacy.  The cost of the vaccine is through your insurance. ?The pharmacy can inform you of the total costs. ? ?Common side effects including soreness in the arm, some redness and swelling, also some feel fatigue muscle soreness headache low-grade fever.  Side effects typically go away within 2 to 3 days. ?Remember-the pain from shingles can last a lifetime but these side effects of the vaccine will only last a few days at most. ?It is very important to get both doses in order to protect yourself fully.  ? ?Please get this vaccine at your earliest convenience at your trusted pharmacy. ? ? ? ? ? ? ? ?DASH Eating Plan ?DASH stands for Dietary Approaches to Stop Hypertension. The DASH eating plan is a healthy eating plan that has been shown to: ?Reduce high blood pressure (hypertension). ?Reduce your risk for type 2 diabetes, heart disease, and stroke. ?Help with weight loss. ?What are tips for following this  plan? ?Reading food labels ?Check food labels for the amount of salt (sodium) per serving. Choose foods with less than 5 percent of the Daily Value of sodium. Generally, foods with less than 300 milligrams (mg) of sodium per serving fit into this eating plan. ?To find whole grains, look for the word "whole" as the first word in the ingredient list. ?Shopping ?Buy products labeled as "low-sodium" or "no salt added." ?Buy fresh foods. Avoid canned foods and pre-made or frozen meals. ?Cooking ?Avoid adding salt when cooking. Use salt-free seasonings or herbs instead of table salt or sea salt. Check with your health care provider or pharmacist before using salt substitutes. ?Do not fry foods. Cook foods using healthy methods such as baking, boiling, grilling, roasting, and broiling instead. ?Cook with heart-healthy oils, such as olive, canola, avocado, soybean, or sunflower oil. ?Meal planning ? ?Eat a balanced diet that includes: ?4 or more servings of fruits and 4 or more servings of vegetables each day. Try to fill one-half of your plate with fruits and vegetables. ?6-8 servings of whole grains each day. ?Less than 6 oz (170 g) of lean meat, poultry, or fish each day. A 3-oz (85-g) serving of meat is about the same size as a deck of cards. One egg equals 1 oz (28 g). ?2-3 servings of low-fat dairy each day. One serving is 1 cup (237 mL). ?1 serving of nuts, seeds, or beans 5 times each  week. ?2-3 servings of heart-healthy fats. Healthy fats called omega-3 fatty acids are found in foods such as walnuts, flaxseeds, fortified milks, and eggs. These fats are also found in cold-water fish, such as sardines, salmon, and mackerel. ?Limit how much you eat of: ?Canned or prepackaged foods. ?Food that is high in trans fat, such as some fried foods. ?Food that is high in saturated fat, such as fatty meat. ?Desserts and other sweets, sugary drinks, and other foods with added sugar. ?Full-fat dairy products. ?Do not salt foods  before eating. ?Do not eat more than 4 egg yolks a week. ?Try to eat at least 2 vegetarian meals a week. ?Eat more home-cooked food and less restaurant, buffet, and fast food. ?Lifestyle ?When eating at a restaurant, ask that your food be prepared with less salt or no salt, if possible. ?If you drink alcohol: ?Limit how much you use to: ?0-1 drink a day for women who are not pregnant. ?0-2 drinks a day for men. ?Be aware of how much alcohol is in your drink. In the [place] one drink equals one 12 oz bottle of beer (355 mL), one 5 oz glass of wine (148 mL), or one 1? oz glass of hard liquor (44 mL). ?General information ?Avoid eating more than 2,300 mg of salt a day. If you have hypertension, you may need to reduce your sodium intake to 1,500 mg a day. ?Work with your health care provider to maintain a healthy body weight or to lose weight. Ask what an ideal weight is for you. ?Get at least 30 minutes of exercise that causes your heart to beat faster (aerobic exercise) most days of the week. Activities may include walking, swimming, or biking. ?Work with your health care provider or dietitian to adjust your eating plan to your individual calorie needs. ?What foods should I eat? ?Fruits ?All fresh, dried, or frozen fruit. Canned fruit in natural juice (without added sugar). ?Vegetables ?Fresh or frozen vegetables (raw, steamed, roasted, or grilled). Low-sodium or reduced-sodium tomato and vegetable juice. Low-sodium or reduced-sodium tomato sauce and tomato paste. Low-sodium or reduced-sodium canned vegetables. ?Grains ?Whole-grain or whole-wheat bread. Whole-grain or whole-wheat pasta. Brown rice. Stanley Reyes. Bulgur. Whole-grain and low-sodium cereals. Pita bread. Low-fat, low-sodium crackers. Whole-wheat flour tortillas. ?Meats and other proteins ?Skinless chicken or [place]. Ground chicken or [place]. Pork with fat trimmed off. Fish and seafood. Egg whites. Dried beans, peas, or lentils. Unsalted nuts, nut  butters, and seeds. Unsalted canned beans. Lean cuts of beef with fat trimmed off. Low-sodium, lean precooked or cured meat, such as sausages or meat loaves. ?Dairy ?Low-fat (1%) or fat-free (skim) milk. Reduced-fat, low-fat, or fat-free cheeses. Nonfat, low-sodium ricotta or cottage cheese. Low-fat or nonfat yogurt. Low-fat, low-sodium cheese. ?Fats and oils ?Soft margarine without trans fats. Vegetable oil. Reduced-fat, low-fat, or light mayonnaise and salad dressings (reduced-sodium). Canola, safflower, olive, avocado, soybean, and sunflower oils. Avocado. ?Seasonings and condiments ?Herbs. Spices. Seasoning mixes without salt. ?Other foods ?Unsalted popcorn and pretzels. Fat-free sweets. ?The items listed above may not be a complete list of foods and beverages you can eat. Contact a dietitian for more information. ?What foods should I avoid? ?Fruits ?Canned fruit in a light or heavy syrup. Fried fruit. Fruit in cream or butter sauce. ?Vegetables ?Creamed or fried vegetables. Vegetables in a cheese sauce. Regular canned vegetables (not low-sodium or reduced-sodium). Regular canned tomato sauce and paste (not low-sodium or reduced-sodium). Regular tomato and vegetable juice (not low-sodium or reduced-sodium). Stanley Reyes. Olives. ?Grains ?[employer]  made with fat, such as croissants, muffins, or some breads. Dry pasta or rice meal packs. ?Meats and other proteins ?Fatty cuts of meat. Ribs. Fried meat. Stanley Reyes. Bologna, salami, and other precooked or cured meats, such as sausages or meat loaves. Fat from the back of a pig (fatback). Bratwurst. Salted nuts and seeds. Canned beans with added salt. Canned or smoked fish. Whole eggs or egg yolks. Chicken or [place] with skin. ?Dairy ?Whole or 2% milk, cream, and half-and-half. Whole or full-fat cream cheese. Whole-fat or sweetened yogurt. Full-fat cheese. Nondairy creamers. Whipped toppings. Processed cheese and cheese spreads. ?Fats and oils ?Butter. Stick margarine. Lard.  Shortening. Ghee. Bacon fat. Tropical oils, such as coconut, palm kernel, or palm oil. ?Seasonings and condiments ?Onion salt, garlic salt, seasoned salt, table salt, and sea salt. Worcestershire sauce. Tartar sauce. Stanley Reyes

## 2021-08-14 NOTE — Progress Notes (Signed)
? ?  Subjective:  ? ? Patient ID: Stanley Reyes, male    DOB: 12/16/1950, 71 y.o.   MRN: [id number] ? ?HPI ?Pt following up on blood pressure. Pt states blood pressure does well at home. Taking blood pressure med in the morning and cholesterol med at night. No issues at this time.  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 ?Review of Systems ? ?   ?Objective:  ? Physical Exam ?General-in no acute distress ?Eyes-no discharge ?Lungs-respiratory rate normal, CTA ?CV-no murmurs,RRR ?Extremities skin warm dry no edema ?Neuro grossly normal ?Behavior normal, alert ? ? ? ? ?   ?Assessment & Plan:  ? ?1. Mixed hyperlipidemia ?Check cholesterol profile continue pravastatin watch diet closely ?- Basic Metabolic Panel (BMET) ?- Lipid Profile ?- Hepatic function panel ?- Hemoglobin A1c ? ?2. Essential hypertension, benign ?Blood pressure under good control continue current measures watch diet minimize salt stay active ?- Basic Metabolic Panel (BMET) ?- Lipid Profile ?- Hepatic function panel ?- Hemoglobin A1c ? ?3. Prediabetes ?Check A1c previous glucose elevated minimize starches in diet ?- Basic Metabolic Panel (BMET) ?- Lipid Profile ?- Hepatic function panel ?- Hemoglobin A1c ? ?4. Fasting hyperglycemia ?Minimize starches in diet regular activity ?- Basic Metabolic Panel (BMET) ?- Lipid Profile ?- Hepatic function panel ?- Hemoglobin A1c ? ?

## 2021-08-15 ENCOUNTER — Encounter: Payer: Self-pay | Admitting: Family Medicine

## 2021-08-15 LAB — HEPATIC FUNCTION PANEL
ALT: 29 IU/L (ref 0–44)
AST: 28 IU/L (ref 0–40)
Albumin: 4.8 g/dL — ABNORMAL HIGH (ref 3.7–4.7)
Alkaline Phosphatase: 98 IU/L (ref 44–121)
Bilirubin Total: 0.8 mg/dL (ref 0.0–1.2)
Bilirubin, Direct: 0.19 mg/dL (ref 0.00–0.40)
Total Protein: 7.2 g/dL (ref 6.0–8.5)

## 2021-08-15 LAB — BASIC METABOLIC PANEL
BUN/Creatinine Ratio: 14 (ref 10–24)
BUN: 16 mg/dL (ref 8–27)
CO2: 23 mmol/L (ref 20–29)
Calcium: 9.9 mg/dL (ref 8.6–10.2)
Chloride: 103 mmol/L (ref 96–106)
Creatinine, Ser: 1.15 mg/dL (ref 0.76–1.27)
Glucose: 111 mg/dL — ABNORMAL HIGH (ref 70–99)
Potassium: 4.4 mmol/L (ref 3.5–5.2)
Sodium: 140 mmol/L (ref 134–144)
eGFR: 68 mL/min/{1.73_m2} (ref >59)

## 2021-08-15 LAB — LIPID PANEL
Chol/HDL Ratio: 3.5 ratio (ref 0.0–5.0)
Cholesterol, Total: 147 mg/dL (ref 100–199)
HDL: 42 mg/dL (ref >39)
LDL Chol Calc (NIH): 84 mg/dL (ref 0–99)
Triglycerides: 118 mg/dL (ref 0–149)
VLDL Cholesterol Cal: 21 mg/dL (ref 5–40)

## 2021-08-15 LAB — HEMOGLOBIN A1C
Est. average glucose Bld gHb Est-mCnc: 131 mg/dL
Hgb A1c MFr Bld: 6.2 % — ABNORMAL HIGH (ref 4.8–5.6)

## 2021-08-16 ENCOUNTER — Ambulatory Visit: Payer: Medicare Other | Admitting: Family Medicine

## 2022-02-17 ENCOUNTER — Encounter: Payer: Self-pay | Admitting: Family Medicine

## 2022-03-06 ENCOUNTER — Telehealth: Payer: Self-pay | Admitting: Family Medicine

## 2022-03-06 NOTE — Telephone Encounter (Signed)
Left message for patient to call back and schedule Medicare Annual Wellness Visit (AWV) to be completed by video or phone.   Last AWV: 02/14/2021   Please schedule at anytime with [place] [facility]     45 minute appointment  Any questions, please contact me at [phone]

## 2022-03-07 ENCOUNTER — Telehealth: Payer: Self-pay | Admitting: Family Medicine

## 2022-03-07 NOTE — Telephone Encounter (Signed)
Patient is wanting to know if he needs labs for AWV on 12/13

## 2022-03-07 NOTE — Telephone Encounter (Signed)
Last labs completed 08/14/21 A1C, Hepatic,Lipid, BMET. Please advise. Thank you

## 2022-03-10 ENCOUNTER — Other Ambulatory Visit: Payer: Self-pay

## 2022-03-10 DIAGNOSIS — I10 Essential (primary) hypertension: Secondary | ICD-10-CM

## 2022-03-10 DIAGNOSIS — Z0000 Encounter for general adult medical examination without abnormal findings: Secondary | ICD-10-CM

## 2022-03-10 DIAGNOSIS — R7303 Prediabetes: Secondary | ICD-10-CM

## 2022-03-10 DIAGNOSIS — E782 Mixed hyperlipidemia: Secondary | ICD-10-CM

## 2022-03-10 NOTE — Telephone Encounter (Signed)
Would recommend metabolic 7, lipid, C4P, liver  Prediabetes, hyperlipidemia, wellness

## 2022-03-10 NOTE — Telephone Encounter (Signed)
Patient has been made aware per lab orders.

## 2022-03-21 ENCOUNTER — Other Ambulatory Visit: Payer: Self-pay | Admitting: Family Medicine

## 2022-03-21 DIAGNOSIS — E782 Mixed hyperlipidemia: Secondary | ICD-10-CM | POA: Diagnosis not present

## 2022-03-21 DIAGNOSIS — Z0000 Encounter for general adult medical examination without abnormal findings: Secondary | ICD-10-CM | POA: Diagnosis not present

## 2022-03-21 DIAGNOSIS — I10 Essential (primary) hypertension: Secondary | ICD-10-CM | POA: Diagnosis not present

## 2022-03-21 DIAGNOSIS — R7303 Prediabetes: Secondary | ICD-10-CM | POA: Diagnosis not present

## 2022-03-22 LAB — BASIC METABOLIC PANEL
BUN/Creatinine Ratio: 17 (ref 10–24)
BUN: 17 mg/dL (ref 8–27)
CO2: 23 mmol/L (ref 20–29)
Calcium: 9.9 mg/dL (ref 8.6–10.2)
Chloride: 104 mmol/L (ref 96–106)
Creatinine, Ser: 1.03 mg/dL (ref 0.76–1.27)
Glucose: 133 mg/dL — ABNORMAL HIGH (ref 70–99)
Potassium: 4.5 mmol/L (ref 3.5–5.2)
Sodium: 143 mmol/L (ref 134–144)
eGFR: 77 mL/min/{1.73_m2} (ref >59)

## 2022-03-22 LAB — HEPATIC FUNCTION PANEL
ALT: 30 IU/L (ref 0–44)
AST: 25 IU/L (ref 0–40)
Albumin: 4.8 g/dL (ref 3.8–4.8)
Alkaline Phosphatase: 91 IU/L (ref 44–121)
Bilirubin Total: 0.6 mg/dL (ref 0.0–1.2)
Bilirubin, Direct: 0.16 mg/dL (ref 0.00–0.40)
Total Protein: 6.9 g/dL (ref 6.0–8.5)

## 2022-03-22 LAB — LIPID PANEL
Chol/HDL Ratio: 4.1 ratio (ref 0.0–5.0)
Cholesterol, Total: 161 mg/dL (ref 100–199)
HDL: 39 mg/dL — ABNORMAL LOW (ref >39)
LDL Chol Calc (NIH): 93 mg/dL (ref 0–99)
Triglycerides: 167 mg/dL — ABNORMAL HIGH (ref 0–149)
VLDL Cholesterol Cal: 29 mg/dL (ref 5–40)

## 2022-03-22 LAB — HEMOGLOBIN A1C
Est. average glucose Bld gHb Est-mCnc: 131 mg/dL
Hgb A1c MFr Bld: 6.2 % — ABNORMAL HIGH (ref 4.8–5.6)

## 2022-04-02 ENCOUNTER — Ambulatory Visit (INDEPENDENT_AMBULATORY_CARE_PROVIDER_SITE_OTHER): Payer: Medicare Other | Admitting: Family Medicine

## 2022-04-02 ENCOUNTER — Encounter: Payer: Self-pay | Admitting: Family Medicine

## 2022-04-02 VITALS — BP 136/80 | HR 86 | Temp 98.1°F | Ht 69.0 in | Wt 193.0 lb

## 2022-04-02 DIAGNOSIS — I10 Essential (primary) hypertension: Secondary | ICD-10-CM | POA: Diagnosis not present

## 2022-04-02 DIAGNOSIS — R7303 Prediabetes: Secondary | ICD-10-CM

## 2022-04-02 DIAGNOSIS — Z0000 Encounter for general adult medical examination without abnormal findings: Secondary | ICD-10-CM | POA: Diagnosis not present

## 2022-04-02 DIAGNOSIS — Z1211 Encounter for screening for malignant neoplasm of colon: Secondary | ICD-10-CM

## 2022-04-02 DIAGNOSIS — E782 Mixed hyperlipidemia: Secondary | ICD-10-CM

## 2022-04-02 MED ORDER — PRAVASTATIN SODIUM 20 MG PO TABS: ORAL_TABLET | ORAL | 1 refills | Status: DC

## 2022-04-02 MED ORDER — AMLODIPINE BESYLATE 5 MG PO TABS: ORAL_TABLET | ORAL | 1 refills | Status: DC

## 2022-04-02 NOTE — Progress Notes (Signed)
04/02/22- referral placed for colonoscopy

## 2022-04-02 NOTE — Progress Notes (Signed)
   Subjective:    Patient ID: Stanley Reyes, male    DOB: Sep 01, 1950, 71 y.o.   MRN: [id number]  HPI AWV- Annual Wellness Visit  The patient was seen for their annual wellness visit. The patient's past medical history, surgical history, and family history were reviewed. Pertinent vaccines were reviewed ( tetanus, pneumonia, shingles, flu) The patient's medication list was reviewed and updated.  The height and weight were entered.  BMI recorded in electronic record elsewhere  Cognitive screening was completed. Outcome of Mini - Cog: yes   Falls /depression screening electronically recorded within record elsewhere  Current tobacco usage:no (All patients who use tobacco were given written and verbal information on quitting)  Recent listing of emergency department/hospitalizations over the past year were reviewed.  current specialist the patient sees on a regular basis: no   Medicare annual wellness visit patient questionnaire was reviewed.  A written screening schedule for the patient for the next 5-10 years was given. Appropriate discussion of followup regarding next visit was discussed.       Review of Systems     Objective:   Physical Exam  General-in no acute distress Eyes-no discharge Lungs-respiratory rate normal, CTA CV-no murmurs,RRR Extremities skin warm dry no edema Neuro grossly normal Behavior normal, alert Prostate not indicated      Assessment & Plan:  1. Mixed hyperlipidemia Hyperlipidemia-importance of diet, weight control, activity, compliance with medications discussed.   Recent labs reviewed.   Any additional labs or refills ordered.   Importance of keeping under good control discussed. Regular follow-up visits discussed   2. Essential hypertension, benign HTN- patient seen for follow-up regarding HTN.   Diet, medication compliance, appropriate labs and refills were completed.   Importance of keeping blood pressure under good  control to lessen the risk of complications discussed Regular follow-up visits discussed   3. Prediabetes Patient not a diabetic.  A1c under good control  4. Wellness examination Adult wellness-complete.wellness physical was conducted today. Importance of diet and exercise were discussed in detail.  Importance of stress reduction and healthy living were discussed.  In addition to this a discussion regarding safety was also covered.  We also reviewed over immunizations and gave recommendations regarding current immunization needed for age.   In addition to this additional areas were also touched on including: Preventative health exams needed:  Colonoscopy patient due early 2024 we will send notification  Patient was advised yearly wellness exam   5. Encounter for subsequent annual wellness visit (AWV) in Medicare patient Wellness discussed Patient defers flu shot  Follow-up 6 months

## 2022-04-02 NOTE — Addendum Note (Signed)
Addended by: Vicente Males on: 04/02/2022 04:54 PM   Modules accepted: Orders

## 2022-05-06 ENCOUNTER — Encounter (INDEPENDENT_AMBULATORY_CARE_PROVIDER_SITE_OTHER): Payer: Self-pay | Admitting: *Deleted

## 2022-07-08 ENCOUNTER — Telehealth (INDEPENDENT_AMBULATORY_CARE_PROVIDER_SITE_OTHER): Payer: Self-pay | Admitting: Gastroenterology

## 2022-07-08 NOTE — Telephone Encounter (Signed)
Any room Thanks 

## 2022-07-08 NOTE — Telephone Encounter (Signed)
Who is your primary care physician: Dr.Scott Luking  Reasons for the colonoscopy: 5 year recall/hx polyps   Have you had a colonoscopy before?  Yes 5 years ago   Do you have family history of colon cancer? No  Previous colonoscopy with polyps removed? Yes  Do you have a history colorectal cancer?   No  Are you diabetic? If yes, Type 1 or Type 2?    No  Do you have a prosthetic or mechanical heart valve? No  Do you have a pacemaker/defibrillator?   No  Have you had endocarditis/atrial fibrillation? no  Have you had joint replacement within the last 12 months?  No  Do you tend to be constipated or have to use laxatives? No  Do you have any history of drugs or alchohol?  No  Do you use supplemental oxygen?  No  Have you had a stroke or heart attack within the last 6 months?No  Do you take weight loss medication? No  Do you take any blood-thinning medications such as: (aspirin, warfarin, Plavix, Aggrenox)  no  If yes we need the name, milligram, dosage and who is prescribing doctor  Current Outpatient Medications on File Prior to Visit  Medication Sig Dispense Refill   amLODipine (NORVASC) 5 MG tablet TAKE ONE (1) TABLET BY MOUTH EVERY DAY 90 tablet 1   Ascorbic Acid (VITA-C PO) Take by mouth.     fish oil-omega-3 fatty acids 1000 MG capsule Take 2 g by mouth every morning.     Multiple Vitamin (MULITIVITAMIN WITH MINERALS) TABS Take 1 tablet by mouth every morning.     pravastatin (PRAVACHOL) 20 MG tablet 1 qd 90 tablet 1   Tetrahydrozoline HCl (VISINE OP) Place 1 drop into both eyes daily as needed (for dry eyes).     No current facility-administered medications on file prior to visit.    No Known Allergies   Pharmacy: [facility]   Primary Insurance Name: Lorella Nimrod  Best number where you can be reached: [phone]

## 2022-07-09 MED ORDER — CLENPIQ 10-3.5-12 MG-GM -GM/175ML PO SOLN: 1.0000 | ORAL | 0 refills | Status: DC

## 2022-07-09 NOTE — Telephone Encounter (Signed)
Questionnaire from recall, no referral needed  

## 2022-07-09 NOTE — Telephone Encounter (Signed)
Pt contacted. Colonoscopy scheduled for 07/25/22. Instructions printed and will be picked up by patient. Prep sent to pharmacy.

## 2022-07-09 NOTE — Addendum Note (Signed)
Addended by: Vicente Males on: 07/09/2022 09:55 AM   Modules accepted: Orders

## 2022-07-25 ENCOUNTER — Ambulatory Visit (HOSPITAL_COMMUNITY): Payer: Medicare Other

## 2022-07-25 ENCOUNTER — Ambulatory Visit (HOSPITAL_BASED_OUTPATIENT_CLINIC_OR_DEPARTMENT_OTHER): Payer: Medicare Other

## 2022-07-25 ENCOUNTER — Other Ambulatory Visit: Payer: Self-pay

## 2022-07-25 ENCOUNTER — Encounter (HOSPITAL_COMMUNITY)
Admission: RE | Disposition: A | Discharge status: Home / Self Care | Payer: Self-pay | Source: Home / Self Care | Attending: Gastroenterology

## 2022-07-25 ENCOUNTER — Ambulatory Visit (HOSPITAL_COMMUNITY)
Admission: RE | Admit: 2022-07-25 | Discharge: 2022-07-25 | Disposition: A | Discharge status: Home / Self Care | Payer: Medicare Other | Attending: Gastroenterology | Admitting: Gastroenterology

## 2022-07-25 ENCOUNTER — Encounter (HOSPITAL_COMMUNITY): Payer: Self-pay | Admitting: Gastroenterology

## 2022-07-25 DIAGNOSIS — I10 Essential (primary) hypertension: Secondary | ICD-10-CM | POA: Diagnosis not present

## 2022-07-25 DIAGNOSIS — D124 Benign neoplasm of descending colon: Secondary | ICD-10-CM | POA: Insufficient documentation

## 2022-07-25 DIAGNOSIS — K5730 Diverticulosis of large intestine without perforation or abscess without bleeding: Secondary | ICD-10-CM | POA: Diagnosis not present

## 2022-07-25 DIAGNOSIS — Z86010 Personal history of colonic polyps: Secondary | ICD-10-CM | POA: Diagnosis not present

## 2022-07-25 DIAGNOSIS — K648 Other hemorrhoids: Secondary | ICD-10-CM

## 2022-07-25 DIAGNOSIS — Z1211 Encounter for screening for malignant neoplasm of colon: Secondary | ICD-10-CM | POA: Diagnosis not present

## 2022-07-25 DIAGNOSIS — D123 Benign neoplasm of transverse colon: Secondary | ICD-10-CM | POA: Diagnosis not present

## 2022-07-25 DIAGNOSIS — K635 Polyp of colon: Secondary | ICD-10-CM | POA: Diagnosis not present

## 2022-07-25 HISTORY — PX: COLONOSCOPY WITH PROPOFOL: SHX5780

## 2022-07-25 HISTORY — PX: POLYPECTOMY: SHX5525

## 2022-07-25 HISTORY — PX: BIOPSY: SHX5522

## 2022-07-25 LAB — HM COLONOSCOPY

## 2022-07-25 SURGERY — COLONOSCOPY WITH PROPOFOL
Anesthesia: General

## 2022-07-25 MED ORDER — LACTATED RINGERS IV SOLN: INTRAVENOUS | Status: DC | PRN

## 2022-07-25 MED ORDER — LIDOCAINE HCL (CARDIAC) PF 100 MG/5ML IV SOSY
PREFILLED_SYRINGE | INTRAVENOUS | Status: DC | PRN
  Administered 2022-07-25: 50 mg via INTRAVENOUS

## 2022-07-25 MED ORDER — LACTATED RINGERS IV SOLN: INTRAVENOUS | Status: DC

## 2022-07-25 MED ORDER — PROPOFOL 500 MG/50ML IV EMUL
INTRAVENOUS | Status: DC | PRN
  Administered 2022-07-25: 150 ug/kg/min via INTRAVENOUS

## 2022-07-25 MED ORDER — PHENYLEPHRINE 80 MCG/ML (10ML) SYRINGE FOR IV PUSH (FOR BLOOD PRESSURE SUPPORT)
PREFILLED_SYRINGE | INTRAVENOUS | Status: DC | PRN
  Administered 2022-07-25: 160 ug via INTRAVENOUS

## 2022-07-25 MED ORDER — PROPOFOL 10 MG/ML IV BOLUS
INTRAVENOUS | Status: DC | PRN
  Administered 2022-07-25 (×2): 50 mg via INTRAVENOUS

## 2022-07-25 MED ORDER — PHENYLEPHRINE 80 MCG/ML (10ML) SYRINGE FOR IV PUSH (FOR BLOOD PRESSURE SUPPORT)
PREFILLED_SYRINGE | INTRAVENOUS | Status: AC
  Filled 2022-07-25: qty 10

## 2022-07-25 NOTE — Op Note (Addendum)
[facility] Patient Name: Stanley Reyes Procedure Date: 07/25/2022 7:14 AM MRN: [id number] Date of Birth: December 07, 1950 Attending MD: Katrinka Blazing , , [id number] CSN: [id number] Age: 72 Admit Type: Outpatient Procedure:                Colonoscopy Indications:              Surveillance: Personal history of adenomatous                            polyps on last colonoscopy > 5 years ago Providers:                Katrinka Blazing, Jannett Celestine, RN, Burke Keels,                            Technician Referring MD:              Medicines:                Monitored Anesthesia Care Complications:            No immediate complications. Estimated Blood Loss:     Estimated blood loss: none. Procedure:                Pre-Anesthesia Assessment:                           - Prior to the procedure, a History and Physical                            was performed, and patient medications, allergies                            and sensitivities were reviewed. The patient's                            tolerance of previous anesthesia was reviewed.                           - The risks and benefits of the procedure and the                            sedation options and risks were discussed with the                            patient. All questions were answered and informed                            consent was obtained.                           - ASA Grade Assessment: II - A patient with mild                            systemic disease.                           After obtaining informed consent, the colonoscope  was passed under direct vision. Throughout the                            procedure, the patient's blood pressure, pulse, and                            oxygen saturations were monitored continuously. The                            [id number]-HQ190L ([id number]) scope was introduced through                            the anus and advanced to the the cecum, identified                             by appendiceal orifice and ileocecal valve. The                            colonoscopy was performed without difficulty. The                            patient tolerated the procedure well. The quality                            of the bowel preparation was excellent. Scope In: 7:38:08 AM Scope Out: 7:57:24 AM Scope Withdrawal Time: 0 hours 16 minutes 53 seconds  Total Procedure Duration: 0 hours 19 minutes 16 seconds  Findings:      The perianal and digital rectal examinations were normal.      A 3 mm polyp was found in the transverse colon. The polyp was sessile.       The polyp was removed with a cold snare. Resection and retrieval were       complete.      A 2 mm polyp was found in the descending colon. The polyp was sessile.       The polyp was removed with a cold biopsy forceps. Resection and       retrieval were complete.      Multiple large-mouthed and small-mouthed diverticula were found in the       entire colon.      Non-bleeding internal hemorrhoids were found during retroflexion. The       hemorrhoids were small. Impression:               - One 3 mm polyp in the transverse colon, removed                            with a cold snare. Resected and retrieved.                           - One 2 mm polyp in the descending colon, removed                            with a cold biopsy forceps. Resected and retrieved.                           -  Diverticulosis in the entire examined colon.                           - Non-bleeding internal hemorrhoids. Moderate Sedation:      Per Anesthesia Care Recommendation:           - Discharge patient to home (ambulatory).                           - Resume previous diet.                           - Await pathology results.                           - Repeat colonoscopy for surveillance based on                            pathology results. Procedure Code(s):        --- Professional ---                            [id number], Colonoscopy, flexible; with removal of                            tumor(s), polyp(s), or other lesion(s) by snare                            technique                           45380, 59, Colonoscopy, flexible; with biopsy,                            single or multiple Diagnosis Code(s):        --- Professional ---                           Z86.010, Personal history of colonic polyps                           D12.3, Benign neoplasm of transverse colon (hepatic                            flexure or splenic flexure)                           D12.4, Benign neoplasm of descending colon                           K64.8, Other hemorrhoids                           K57.30, Diverticulosis of large intestine without                            perforation or abscess without bleeding CPT copyright 2022 American Medical Association. All rights reserved. The codes documented in  this report are preliminary and upon coder review may  be revised to meet current compliance requirements. Katrinka Blazinganiel Castaneda, MD Katrinka Blazinganiel Castaneda,  07/25/2022 8:08:20 AM This report has been signed electronically. Number of Addenda: 0

## 2022-07-25 NOTE — Discharge Instructions (Addendum)
You are being discharged to home.  Resume your previous diet.  We are waiting for your pathology results.  Your physician has recommended a repeat colonoscopy for surveillance based on pathology results.  

## 2022-07-25 NOTE — Anesthesia Preprocedure Evaluation (Signed)
Anesthesia Evaluation  Patient identified by MRN, date of birth, ID band Patient awake    Reviewed: Allergy & Precautions, H&P , NPO status , Patient's Chart, lab work & pertinent test results, reviewed documented beta blocker date and time   Airway Mallampati: II  TM Distance: >3 FB Neck ROM: full    Dental no notable dental hx.    Pulmonary neg pulmonary ROS   Pulmonary exam normal breath sounds clear to auscultation       Cardiovascular Exercise Tolerance: Good hypertension, negative cardio ROS  Rhythm:regular Rate:Normal     Neuro/Psych negative neurological ROS  negative psych ROS   GI/Hepatic negative GI ROS, Neg liver ROS,,,  Endo/Other  negative endocrine ROS    Renal/GU Renal diseasenegative Renal ROS  negative genitourinary   Musculoskeletal   Abdominal   Peds  Hematology negative hematology ROS (+)   Anesthesia Other Findings   Reproductive/Obstetrics negative OB ROS                             Anesthesia Physical Anesthesia Plan  ASA: 2  Anesthesia Plan: General   Post-op Pain Management:    Induction:   PONV Risk Score and Plan: Propofol infusion  Airway Management Planned:   Additional Equipment:   Intra-op Plan:   Post-operative Plan:   Informed Consent: I have reviewed the patients History and Physical, chart, labs and discussed the procedure including the risks, benefits and alternatives for the proposed anesthesia with the patient or authorized representative who has indicated his/her understanding and acceptance.     Dental Advisory Given  Plan Discussed with: CRNA  Anesthesia Plan Comments:        Anesthesia Quick Evaluation

## 2022-07-25 NOTE — Anesthesia Postprocedure Evaluation (Signed)
Anesthesia Post Note  Patient: Stanley Reyes  Procedure(s) Performed: COLONOSCOPY WITH PROPOFOL POLYPECTOMY BIOPSY  Patient location during evaluation: Phase II Anesthesia Type: General Level of consciousness: awake Pain management: pain level controlled Vital Signs Assessment: post-procedure vital signs reviewed and stable Respiratory status: spontaneous breathing and respiratory function stable Cardiovascular status: blood pressure returned to baseline and stable Postop Assessment: no headache and no apparent nausea or vomiting Anesthetic complications: no Comments: Late entry   No notable events documented.   Last Vitals:  Vitals:   07/25/22 0800 07/25/22 0803  BP:  93/65  Pulse: 80   Resp: (!) 21   Temp: 36.4 C   SpO2: 97%     Last Pain:  Vitals:   07/25/22 0800  TempSrc: Oral  PainSc: 0-No pain                 Windell Norfolk

## 2022-07-25 NOTE — Transfer of Care (Signed)
Immediate Anesthesia Transfer of Care Note  Patient: Stanley Reyes  Procedure(s) Performed: COLONOSCOPY WITH PROPOFOL POLYPECTOMY BIOPSY  Patient Location: Endoscopy Unit  Anesthesia Type:General  Level of Consciousness: awake, alert , and oriented  Airway & Oxygen Therapy: Patient Spontanous Breathing  Post-op Assessment: Report given to RN and Post -op Vital signs reviewed and stable  Post vital signs: Reviewed and stable  Last Vitals:  Vitals Value Taken Time  BP    Temp    Pulse    Resp    SpO2      Last Pain:  Vitals:   07/25/22 0637  TempSrc: Oral  PainSc: 0-No pain      Patients Stated Pain Goal: 5 (07/25/22 5537)  Complications: No notable events documented.

## 2022-07-25 NOTE — Anesthesia Procedure Notes (Signed)
Date/Time: 07/25/2022 7:40 AM  Performed by: Julian Reil, CRNAPre-anesthesia Checklist: Patient identified, Emergency Drugs available, Suction available and Patient being monitored Patient Re-evaluated:Patient Re-evaluated prior to induction Oxygen Delivery Method: Nasal cannula Induction Type: IV induction Placement Confirmation: positive ETCO2

## 2022-07-25 NOTE — H&P (Signed)
Stanley Reyes is an 72 y.o. male.   Chief Complaint: history colon polyps HPI: 72 y/o M with PMH arthritis and HTN, coming for history colon polyps. Last colonoscopy in 2019, had one polyp tubulkar adenoma removed.  The patient denies having any complaints such as melena, hematochezia, abdominal pain or distention, change in her bowel movement consistency or frequency, no changes in weight recently.  No family history of colorectal cancer.   Past Medical History:  Diagnosis Date   Arthritis    Colon polyps    Hemorrhoids    Hypercholesteremia    Hypertension     Past Surgical History:  Procedure Laterality Date   CATARACT EXTRACTION W/PHACO  08/04/2011   Procedure: CATARACT EXTRACTION PHACO AND INTRAOCULAR LENS PLACEMENT (IOC);  Surgeon: Susa Simmonds, MD;  Location: [facility];  Service: Ophthalmology;  Laterality: Right;  CDE:  5.60   CATARACT EXTRACTION W/PHACO Left 05/28/2015   Procedure: CATARACT EXTRACTION PHACO AND INTRAOCULAR LENS PLACEMENT (IOC);  Surgeon: Susa Simmonds, MD;  Location: [facility];  Service: Ophthalmology;  Laterality: Left;  CDE 8.13   COLONOSCOPY  03/05/2012   Procedure: COLONOSCOPY;  Surgeon: Malissa Hippo, MD;  Location: [facility];  Service: Endoscopy;  Laterality: N/A;  1225-changed to 1200 Ann to notify pt   COLONOSCOPY N/A 05/14/2017   Procedure: COLONOSCOPY;  Surgeon: Malissa Hippo, MD;  Location: [facility];  Service: Endoscopy;  Laterality: N/A;  930   REPAIR BIFID DIGIT  2000   repair of 3rd and 4th digit.  Fingers   ROTATOR CUFF REPAIR Left 2007    Family History  Problem Relation Age of Onset   Heart attack Sister    Hyperlipidemia Sister    Heart attack Brother    CAD Sister    Hyperlipidemia Sister    Cancer Brother    Cancer Brother    COPD Sister    Colon cancer Neg Hx    Social History:  reports that he has never smoked. He has never used smokeless tobacco. He reports that he does not drink alcohol and does not use  drugs.  Allergies: No Known Allergies  Medications Prior to Admission  Medication Sig Dispense Refill   amLODipine (NORVASC) 5 MG tablet TAKE ONE (1) TABLET BY MOUTH EVERY DAY 90 tablet 1   ascorbic acid (VITAMIN C) 500 MG tablet Take 500 mg by mouth daily.     cetirizine (ZYRTEC) 10 MG tablet Take 10 mg by mouth daily as needed for allergies.     fish oil-omega-3 fatty acids 1000 MG capsule Take 1 g by mouth in the morning and at bedtime.     Multiple Vitamin (MULITIVITAMIN WITH MINERALS) TABS Take 1 tablet by mouth every morning.     pravastatin (PRAVACHOL) 20 MG tablet 1 qd (Patient taking differently: Take 20 mg by mouth every evening.) 90 tablet 1   Sod Picosulfate-Mag Ox-Cit Acd (CLENPIQ) 10-3.5-12 MG-GM -GM/175ML SOLN Take 1 kit by mouth as directed. 350 mL 0   Tetrahydrozoline HCl (VISINE OP) Place 1 drop into both eyes daily as needed (for dry eyes).      No results found for this or any previous visit (from the past 48 hour(s)). No results found.  Review of Systems  All other systems reviewed and are negative.   Blood pressure (!) 156/92, pulse 69, temperature 98 F (36.7 C), temperature source Oral, height 5\' 10"  (1.778 m), weight 83.9 kg. Physical Exam  GENERAL: The patient is AO  x3, in no acute distress. HEENT: Head is normocephalic and atraumatic. EOMI are intact. Mouth is well hydrated and without lesions. NECK: Supple. No masses LUNGS: Clear to auscultation. No presence of rhonchi/wheezing/rales. Adequate chest expansion HEART: RRR, normal s1 and s2. ABDOMEN: Soft, nontender, no guarding, no peritoneal signs, and nondistended. BS +. No masses. EXTREMITIES: Without any cyanosis, clubbing, rash, lesions or edema. NEUROLOGIC: AOx3, no focal motor deficit. SKIN: no jaundice, no rashes  Assessment/Plan 72 y/o M with PMH arthritis and HTN, coming for history colon polyps. We will proceed with colonoscopy  Dolores Frameaniel Castaneda Mayorga, MD 07/25/2022, 7:29 AM

## 2022-07-28 ENCOUNTER — Encounter (INDEPENDENT_AMBULATORY_CARE_PROVIDER_SITE_OTHER): Payer: Self-pay | Admitting: *Deleted

## 2022-07-28 LAB — SURGICAL PATHOLOGY

## 2022-07-29 ENCOUNTER — Encounter (INDEPENDENT_AMBULATORY_CARE_PROVIDER_SITE_OTHER): Payer: Self-pay | Admitting: *Deleted

## 2022-08-04 ENCOUNTER — Encounter (HOSPITAL_COMMUNITY): Payer: Self-pay | Admitting: Gastroenterology

## 2022-10-02 ENCOUNTER — Encounter: Payer: Self-pay | Admitting: Family Medicine

## 2022-10-02 ENCOUNTER — Ambulatory Visit (INDEPENDENT_AMBULATORY_CARE_PROVIDER_SITE_OTHER): Payer: Medicare Other | Admitting: Family Medicine

## 2022-10-02 VITALS — BP 132/70 | HR 64 | Ht 70.0 in | Wt 190.2 lb

## 2022-10-02 DIAGNOSIS — R7303 Prediabetes: Secondary | ICD-10-CM | POA: Diagnosis not present

## 2022-10-02 DIAGNOSIS — Z79899 Other long term (current) drug therapy: Secondary | ICD-10-CM

## 2022-10-02 DIAGNOSIS — Z125 Encounter for screening for malignant neoplasm of prostate: Secondary | ICD-10-CM

## 2022-10-02 DIAGNOSIS — I10 Essential (primary) hypertension: Secondary | ICD-10-CM

## 2022-10-02 DIAGNOSIS — E782 Mixed hyperlipidemia: Secondary | ICD-10-CM

## 2022-10-02 MED ORDER — PRAVASTATIN SODIUM 20 MG PO TABS: 20.0000 mg | ORAL_TABLET | Freq: Every evening | ORAL | 1 refills | Status: DC

## 2022-10-02 MED ORDER — AMLODIPINE BESYLATE 5 MG PO TABS: ORAL_TABLET | ORAL | 1 refills | Status: DC

## 2022-10-02 NOTE — Progress Notes (Signed)
   Subjective:    Patient ID: Stanley Reyes, male    DOB: Oct 20, 1950, 72 y.o.   MRN: [id number]  HPI Patient arrives 6 month follow up. Patient states no concerns or issues today.   Patient for blood pressure check up.  The patient does have hypertension.   Patient relates dietary measures try to minimize salt The importance of healthy diet and activity were discussed Patient relates compliance  Patient here for follow-up regarding cholesterol.    Patient relates taking medication on a regular basis Denies problems with medication Importance of dietary measures discussed Regular lab work regarding lipid and liver was checked and if needing additional labs was appropriately ordered   Review of Systems     Objective:   Physical Exam General-in no acute distress Eyes-no discharge Lungs-respiratory rate normal, CTA CV-no murmurs,RRR Extremities skin warm dry no edema Neuro grossly normal Behavior normal, alert        Assessment & Plan:  1. Mixed hyperlipidemia Patient will do lipid before his follow-up visit in December healthy diet regular activity recommended continue medicine - Lipid panel  2. Essential hypertension, benign BP good continue medication watch salt in diet stay active check labs before follow-up - Basic Metabolic Panel - Microalbumin / creatinine urine ratio  3. Prediabetes Minimize starches in the diet check labs before follow-up - Hemoglobin A1c  4. Screening PSA (prostate specific antigen) Screening PSA later this year before wellness - PSA  5. High risk medication use Liver panel later this year - Hepatic function panel

## 2023-03-25 DIAGNOSIS — I10 Essential (primary) hypertension: Secondary | ICD-10-CM | POA: Diagnosis not present

## 2023-03-25 DIAGNOSIS — E782 Mixed hyperlipidemia: Secondary | ICD-10-CM | POA: Diagnosis not present

## 2023-03-25 DIAGNOSIS — R7303 Prediabetes: Secondary | ICD-10-CM | POA: Diagnosis not present

## 2023-03-25 DIAGNOSIS — Z79899 Other long term (current) drug therapy: Secondary | ICD-10-CM | POA: Diagnosis not present

## 2023-03-25 DIAGNOSIS — Z125 Encounter for screening for malignant neoplasm of prostate: Secondary | ICD-10-CM | POA: Diagnosis not present

## 2023-03-27 LAB — LIPID PANEL
Chol/HDL Ratio: 4.4 {ratio} (ref 0.0–5.0)
Cholesterol, Total: 170 mg/dL (ref 100–199)
HDL: 39 mg/dL — ABNORMAL LOW (ref >39)
LDL Chol Calc (NIH): 102 mg/dL — ABNORMAL HIGH (ref 0–99)
Triglycerides: 168 mg/dL — ABNORMAL HIGH (ref 0–149)
VLDL Cholesterol Cal: 29 mg/dL (ref 5–40)

## 2023-03-27 LAB — HEPATIC FUNCTION PANEL
ALT: 31 [IU]/L (ref 0–44)
AST: 29 [IU]/L (ref 0–40)
Albumin: 4.6 g/dL (ref 3.8–4.8)
Alkaline Phosphatase: 105 [IU]/L (ref 44–121)
Bilirubin Total: 1 mg/dL (ref 0.0–1.2)
Bilirubin, Direct: 0.26 mg/dL (ref 0.00–0.40)
Total Protein: 6.9 g/dL (ref 6.0–8.5)

## 2023-03-27 LAB — BASIC METABOLIC PANEL
BUN/Creatinine Ratio: 12 (ref 10–24)
BUN: 14 mg/dL (ref 8–27)
CO2: 23 mmol/L (ref 20–29)
Calcium: 9.5 mg/dL (ref 8.6–10.2)
Chloride: 103 mmol/L (ref 96–106)
Creatinine, Ser: 1.16 mg/dL (ref 0.76–1.27)
Glucose: 143 mg/dL — ABNORMAL HIGH (ref 70–99)
Potassium: 3.8 mmol/L (ref 3.5–5.2)
Sodium: 140 mmol/L (ref 134–144)
eGFR: 66 mL/min/{1.73_m2} (ref >59)

## 2023-03-27 LAB — MICROALBUMIN / CREATININE URINE RATIO
Creatinine, Urine: 81.3 mg/dL
Microalb/Creat Ratio: 7 mg/g{creat} (ref 0–29)
Microalbumin, Urine: 5.9 ug/mL

## 2023-03-27 LAB — HEMOGLOBIN A1C
Est. average glucose Bld gHb Est-mCnc: 137 mg/dL
Hgb A1c MFr Bld: 6.4 % — ABNORMAL HIGH (ref 4.8–5.6)

## 2023-03-27 LAB — PSA: Prostate Specific Ag, Serum: 0.6 ng/mL (ref 0.0–4.0)

## 2023-04-01 ENCOUNTER — Ambulatory Visit: Payer: Medicare Other | Admitting: Family Medicine

## 2023-04-01 ENCOUNTER — Encounter: Payer: Self-pay | Admitting: Family Medicine

## 2023-04-01 VITALS — BP 136/76 | HR 70 | Temp 98.1°F | Ht 70.0 in | Wt 192.0 lb

## 2023-04-01 DIAGNOSIS — E782 Mixed hyperlipidemia: Secondary | ICD-10-CM | POA: Diagnosis not present

## 2023-04-01 DIAGNOSIS — R7303 Prediabetes: Secondary | ICD-10-CM

## 2023-04-01 DIAGNOSIS — J069 Acute upper respiratory infection, unspecified: Secondary | ICD-10-CM | POA: Diagnosis not present

## 2023-04-01 DIAGNOSIS — I10 Essential (primary) hypertension: Secondary | ICD-10-CM

## 2023-04-01 MED ORDER — AMLODIPINE BESYLATE 5 MG PO TABS: ORAL_TABLET | ORAL | 1 refills | Status: DC

## 2023-04-01 MED ORDER — PRAVASTATIN SODIUM 20 MG PO TABS: 20.0000 mg | ORAL_TABLET | Freq: Every evening | ORAL | 1 refills | Status: DC

## 2023-04-01 NOTE — Progress Notes (Signed)
   Subjective:    Patient ID: Stanley Reyes, male    DOB: 1950-08-23, 72 y.o.   MRN: [id number]  History of Present Illness   The patient presents with chronic knee pain, particularly on the left side, described as 'bone on bone.' The pain is tolerable and does not significantly impede mobility, but it does cause stiffness, particularly after periods of inactivity. The discomfort is exacerbated by standing on hard surfaces such as cement floors. The patient manages the pain by moving around or sitting down for brief periods, and occasionally takes ibuprofen for relief.  The patient has a history of a knee injury sustained approximately 40 years ago while playing basketball. He has considered orthopedic intervention, including injections, but prefers to avoid surgery unless absolutely necessary.  In addition to the knee pain, the patient has been diagnosed with prediabetes, with a recent increase in his A1c from 6.2 to 6.4. He is aware of the need for dietary modifications to manage this condition.  The patient is also on medication for high blood pressure and cholesterol, which he takes regularly. He has been taking fish oil supplements we will discontinue this due to recent studies questioning its efficacy.  The patient has not reported any issues with bowel movements, urination, or breathing. He denies any chest pressure, tightness, or pain. He has also noted occasional swelling in one leg after being on it all day, but this resolves with rest.         Review of Systems     Objective:    Physical Exam   HEENT: Ears and oral cavity without abnormalities. EXTREMITIES: Edema in left lower extremity. MUSCULOSKELETAL: Osteoarthritis in both knees, more pronounced on the left.     General-in no acute distress Eyes-no discharge Lungs-respiratory rate normal, CTA CV-no murmurs,RRR Extremities skin warm dry no edema Neuro grossly normal Behavior normal, alert       Assessment &  Plan:  Assessment and Plan    Osteoarthritis of the knees Predominantly left-sided, causing discomfort especially when standing for prolonged periods. No current interest in surgical intervention. -Consider referral to orthopedics for possible knee injection if symptoms worsen.  Prediabetes A1c has increased to 6.4, nearing the threshold for diabetes diagnosis. -Continue to monitor A1c annually. -Encourage healthy diet, minimizing sugary and starchy foods, and maintaining physical activity.  Hyperlipidemia Slight increase in LDL cholesterol. -Continue current cholesterol medication. -Encourage healthy diet. -Repeat lipid panel in 6 months to monitor trend.  General Health Maintenance -Discontinue fish oil supplement. -Continue current blood pressure medication. -Order lab work 1 week prior to next visit in 6 months.     1. Essential hypertension, benign HTN- patient seen for follow-up regarding HTN.   Diet, medication compliance, appropriate labs and refills were completed.   Importance of keeping blood pressure under good control to lessen the risk of complications discussed Regular follow-up visits discussed   2. Mixed hyperlipidemia Hyperlipidemia-importance of diet, weight control, activity, compliance with medications discussed.   Recent labs reviewed.   Any additional labs or refills ordered.   Importance of keeping under good control discussed. Regular follow-up visits discussed   3. Prediabetes Portion control stay active healthy diet read look at this again in 6 to 12 months  4. Viral URI Viral process warning signs discussed no need for intervention currently no antibiotics necessary  PSA normal

## 2023-10-02 ENCOUNTER — Other Ambulatory Visit: Payer: Self-pay | Admitting: *Deleted

## 2023-10-02 DIAGNOSIS — Z79899 Other long term (current) drug therapy: Secondary | ICD-10-CM

## 2023-10-02 DIAGNOSIS — I10 Essential (primary) hypertension: Secondary | ICD-10-CM

## 2023-10-02 DIAGNOSIS — R7303 Prediabetes: Secondary | ICD-10-CM | POA: Diagnosis not present

## 2023-10-02 DIAGNOSIS — E782 Mixed hyperlipidemia: Secondary | ICD-10-CM

## 2023-10-03 ENCOUNTER — Ambulatory Visit: Payer: Self-pay | Admitting: Family Medicine

## 2023-10-03 LAB — BASIC METABOLIC PANEL WITH GFR
BUN/Creatinine Ratio: 16 (ref 10–24)
BUN: 16 mg/dL (ref 8–27)
CO2: 21 mmol/L (ref 20–29)
Calcium: 9.9 mg/dL (ref 8.6–10.2)
Chloride: 104 mmol/L (ref 96–106)
Creatinine, Ser: 1.02 mg/dL (ref 0.76–1.27)
Glucose: 127 mg/dL — ABNORMAL HIGH (ref 70–99)
Potassium: 4.1 mmol/L (ref 3.5–5.2)
Sodium: 142 mmol/L (ref 134–144)
eGFR: 77 mL/min/{1.73_m2} (ref >59)

## 2023-10-03 LAB — LIPID PANEL
Chol/HDL Ratio: 4.2 ratio (ref 0.0–5.0)
Cholesterol, Total: 162 mg/dL (ref 100–199)
HDL: 39 mg/dL — ABNORMAL LOW (ref >39)
LDL Chol Calc (NIH): 97 mg/dL (ref 0–99)
Triglycerides: 149 mg/dL (ref 0–149)
VLDL Cholesterol Cal: 26 mg/dL (ref 5–40)

## 2023-10-03 LAB — HEPATIC FUNCTION PANEL
ALT: 28 IU/L (ref 0–44)
AST: 31 IU/L (ref 0–40)
Albumin: 4.6 g/dL (ref 3.8–4.8)
Alkaline Phosphatase: 100 IU/L (ref 44–121)
Bilirubin Total: 0.8 mg/dL (ref 0.0–1.2)
Bilirubin, Direct: 0.23 mg/dL (ref 0.00–0.40)
Total Protein: 7 g/dL (ref 6.0–8.5)

## 2023-10-03 LAB — HEMOGLOBIN A1C
Est. average glucose Bld gHb Est-mCnc: 128 mg/dL
Hgb A1c MFr Bld: 6.1 % — ABNORMAL HIGH (ref 4.8–5.6)

## 2023-10-07 ENCOUNTER — Encounter: Payer: Self-pay | Admitting: Family Medicine

## 2023-10-07 ENCOUNTER — Ambulatory Visit: Payer: Medicare Other | Admitting: Family Medicine

## 2023-10-07 VITALS — BP 128/78 | HR 63 | Temp 97.7°F | Ht 70.0 in | Wt 189.6 lb

## 2023-10-07 DIAGNOSIS — I10 Essential (primary) hypertension: Secondary | ICD-10-CM | POA: Diagnosis not present

## 2023-10-07 DIAGNOSIS — E782 Mixed hyperlipidemia: Secondary | ICD-10-CM

## 2023-10-07 DIAGNOSIS — R7303 Prediabetes: Secondary | ICD-10-CM | POA: Diagnosis not present

## 2023-10-07 DIAGNOSIS — M1712 Unilateral primary osteoarthritis, left knee: Secondary | ICD-10-CM | POA: Diagnosis not present

## 2023-10-07 MED ORDER — AMLODIPINE BESYLATE 5 MG PO TABS: ORAL_TABLET | ORAL | 1 refills | Status: DC

## 2023-10-07 MED ORDER — PRAVASTATIN SODIUM 20 MG PO TABS: 20.0000 mg | ORAL_TABLET | Freq: Every evening | ORAL | 1 refills | Status: DC

## 2023-10-07 NOTE — Progress Notes (Signed)
 Subjective:    Patient ID: Stanley Reyes, male    DOB: Dec 26, 1950, 73 y.o.   MRN: [id number]  HPI Discussed the use of AI scribe software for clinical note transcription with the patient, who gave verbal consent to proceed.  History of Present Illness   Mason Dibiasio is a 73 year old male with osteoarthritis who presents with knee pain and stiffness.  He experiences stiffness in his knee after walking at a fast pace for more than ten minutes, describing the sensation as 'stiffening up'. No giving way or falls. He mentions that the condition is 'bone on bone' on one side. He can walk at a normal pace for a long time without issues, but standing in one spot for extended periods requires him to shift weight to his right leg or sit down. Riding in a car does not exacerbate his symptoms.  He has a history of a knee injury from playing basketball many years ago, which was diagnosed as having something floating around, likely cartilage, and was advised to have surgery, which he did not pursue. He has been managing the condition for approximately forty years by avoiding fast-paced walking and is currently putting up with the symptoms as long as possible.  He has a history of osteoarthritis and prediabetes with an A1c of 6.1, down from 6.4 six months ago. He is on medication for blood pressure and cholesterol, which he takes regularly. No issues with breathing, bowel movements, or urination. He experiences some sleep disturbances, waking up earlier than desired, but occasionally takes naps during the day.        Review of Systems     Objective:   Physical Exam General-in no acute distress Eyes-no discharge Lungs-respiratory rate normal, CTA CV-no murmurs,RRR Extremities skin warm dry no edema Neuro grossly normal Behavior normal, alert  Osteoarthritis noted on the right knee  Results for orders placed or performed in visit on 10/02/23  Lipid panel   Collection Time:  10/02/23  8:45 AM  Result Value Ref Range   Cholesterol, Total 162 100 - 199 mg/dL   Triglycerides 409 0 - 149 mg/dL   HDL 39 (L) >81 mg/dL   VLDL Cholesterol Cal 26 5 - 40 mg/dL   LDL Chol Calc (NIH) 97 0 - 99 mg/dL   Chol/HDL Ratio 4.2 0.0 - 5.0 ratio  Hepatic function panel   Collection Time: 10/02/23  8:45 AM  Result Value Ref Range   Total Protein 7.0 6.0 - 8.5 g/dL   Albumin 4.6 3.8 - 4.8 g/dL   Bilirubin Total 0.8 0.0 - 1.2 mg/dL   Bilirubin, Direct 1.91 0.00 - 0.40 mg/dL   Alkaline Phosphatase 100 44 - 121 IU/L   AST 31 0 - 40 IU/L   ALT 28 0 - 44 IU/L  Basic metabolic panel with GFR   Collection Time: 10/02/23  8:45 AM  Result Value Ref Range   Glucose 127 (H) 70 - 99 mg/dL   BUN 16 8 - 27 mg/dL   Creatinine, Ser 4.78 0.76 - 1.27 mg/dL   eGFR 77 >29 FA/OZH/0.86   BUN/Creatinine Ratio 16 10 - 24   Sodium 142 134 - 144 mmol/L   Potassium 4.1 3.5 - 5.2 mmol/L   Chloride 104 96 - 106 mmol/L   CO2 21 20 - 29 mmol/L   Calcium 9.9 8.6 - 10.2 mg/dL  Hemoglobin V7Q   Collection Time: 10/02/23  8:45 AM  Result Value Ref Range   Hgb  A1c MFr Bld 6.1 (H) 4.8 - 5.6 %   Est. average glucose Bld gHb Est-mCnc 128 mg/dL       Osteoarthritis of the knee Chronic left knee osteoarthritis with bone-on-bone contact, managed conservatively. Aware of surgical options but prefers to delay surgery unless symptoms worsen. - Continue conservative management. - Consider orthopedic referral if symptoms worsen significantly in the next 2-3 years.  Hypertension Hypertension well-controlled with current medication. Blood pressure within target range. - Continue current antihypertensive regimen.  Hyperlipidemia Hyperlipidemia managed with medication. Improved lipid profile; HDL low due to genetic factors. - Continue current cholesterol-lowering regimen.  Prediabetes Prediabetes with improved A1c. Advised to maintain healthy habits to prevent diabetes progression. - Monitor A1c levels. -  Maintain healthy lifestyle.  General Health Maintenance Routine health maintenance current. Compliant with medications, overall health good. - Schedule follow-up in six months for routine check-up and labs. - Order PSA test and routine blood work prior to next visit.           Assessment & Plan:  1. Essential hypertension, benign (Primary) BP good control continue current medication.  Recent labs reviewed  2. Prediabetes Stable healthy diet recommended minimize carbohydrates  3. Mixed hyperlipidemia Under reasonable control with statin continue current medication  Osteoarthritis left knee currently right now not bad enough for him to do surgery he will use Tylenol as needed let us  know if any ongoing troubles

## 2023-10-22 ENCOUNTER — Other Ambulatory Visit: Payer: Self-pay

## 2023-10-22 DIAGNOSIS — Z79899 Other long term (current) drug therapy: Secondary | ICD-10-CM

## 2023-10-22 DIAGNOSIS — I10 Essential (primary) hypertension: Secondary | ICD-10-CM

## 2023-10-22 DIAGNOSIS — Z125 Encounter for screening for malignant neoplasm of prostate: Secondary | ICD-10-CM

## 2023-10-22 DIAGNOSIS — E782 Mixed hyperlipidemia: Secondary | ICD-10-CM

## 2023-10-22 DIAGNOSIS — R7303 Prediabetes: Secondary | ICD-10-CM

## 2024-01-20 DIAGNOSIS — H04123 Dry eye syndrome of bilateral lacrimal glands: Secondary | ICD-10-CM | POA: Diagnosis not present

## 2024-02-24 ENCOUNTER — Encounter (INDEPENDENT_AMBULATORY_CARE_PROVIDER_SITE_OTHER): Payer: Self-pay | Admitting: Gastroenterology

## 2024-04-12 LAB — HEMOGLOBIN A1C
Est. average glucose Bld gHb Est-mCnc: 134 mg/dL
Hgb A1c MFr Bld: 6.3 % — ABNORMAL HIGH (ref 4.8–5.6)

## 2024-04-12 LAB — BASIC METABOLIC PANEL WITH GFR
BUN/Creatinine Ratio: 16 (ref 10–24)
BUN: 17 mg/dL (ref 8–27)
CO2: 24 mmol/L (ref 20–29)
Calcium: 9.7 mg/dL (ref 8.6–10.2)
Chloride: 105 mmol/L (ref 96–106)
Creatinine, Ser: 1.05 mg/dL (ref 0.76–1.27)
Glucose: 131 mg/dL — ABNORMAL HIGH (ref 70–99)
Potassium: 4.4 mmol/L (ref 3.5–5.2)
Sodium: 141 mmol/L (ref 134–144)
eGFR: 74 mL/min/1.73

## 2024-04-12 LAB — HEPATIC FUNCTION PANEL
ALT: 27 IU/L (ref 0–44)
AST: 21 IU/L (ref 0–40)
Albumin: 4.6 g/dL (ref 3.8–4.8)
Alkaline Phosphatase: 95 IU/L (ref 47–123)
Bilirubin Total: 0.5 mg/dL (ref 0.0–1.2)
Bilirubin, Direct: 0.16 mg/dL (ref 0.00–0.40)
Total Protein: 6.8 g/dL (ref 6.0–8.5)

## 2024-04-12 LAB — LIPID PANEL
Chol/HDL Ratio: 3.9 ratio (ref 0.0–5.0)
Cholesterol, Total: 158 mg/dL (ref 100–199)
HDL: 41 mg/dL
LDL Chol Calc (NIH): 93 mg/dL (ref 0–99)
Triglycerides: 137 mg/dL (ref 0–149)
VLDL Cholesterol Cal: 24 mg/dL (ref 5–40)

## 2024-04-12 LAB — MICROALBUMIN / CREATININE URINE RATIO
Creatinine, Urine: 81.9 mg/dL
Microalb/Creat Ratio: 5 mg/g{creat} (ref 0–29)
Microalbumin, Urine: 4.4 ug/mL

## 2024-04-12 LAB — PSA: Prostate Specific Ag, Serum: 0.7 ng/mL (ref 0.0–4.0)

## 2024-04-13 ENCOUNTER — Ambulatory Visit: Payer: Self-pay | Admitting: Family Medicine

## 2024-04-15 ENCOUNTER — Ambulatory Visit: Admitting: Family Medicine

## 2024-04-15 VITALS — BP 132/80 | HR 66 | Temp 97.5°F | Ht 70.0 in | Wt 191.5 lb

## 2024-04-15 DIAGNOSIS — E782 Mixed hyperlipidemia: Secondary | ICD-10-CM

## 2024-04-15 DIAGNOSIS — R7303 Prediabetes: Secondary | ICD-10-CM

## 2024-04-15 DIAGNOSIS — M1712 Unilateral primary osteoarthritis, left knee: Secondary | ICD-10-CM | POA: Diagnosis not present

## 2024-04-15 DIAGNOSIS — I10 Essential (primary) hypertension: Secondary | ICD-10-CM | POA: Diagnosis not present

## 2024-04-15 MED ORDER — AMLODIPINE BESYLATE 5 MG PO TABS: ORAL_TABLET | ORAL | 1 refills | Status: AC

## 2024-04-15 MED ORDER — PRAVASTATIN SODIUM 20 MG PO TABS: 20.0000 mg | ORAL_TABLET | Freq: Every evening | ORAL | 1 refills | Status: AC

## 2024-04-15 NOTE — Progress Notes (Signed)
 "  Subjective:    Patient ID: Stanley Reyes, male    DOB: 07-18-1950, 73 y.o.   MRN: [id number]  HPI Patient is in room 10  Patient is here for a 6 month follow up. Did not state any concerns.   Patient for blood pressure check up.  The patient does have hypertension.   Patient relates dietary measures try to minimize salt The importance of healthy diet and activity were discussed Patient relates compliance  Patient here for follow-up regarding cholesterol.    Patient relates taking medication on a regular basis Denies problems with medication Importance of dietary measures discussed Regular lab work regarding lipid and liver was checked and if needing additional labs was appropriately ordered  Prediabetes We did discuss labs today Results for orders placed or performed in visit on 10/22/23  PSA   Collection Time: 04/11/24  8:23 AM  Result Value Ref Range   Prostate Specific Ag, Serum 0.7 0.0 - 4.0 ng/mL  Hemoglobin A1c   Collection Time: 04/11/24  8:23 AM  Result Value Ref Range   Hgb A1c MFr Bld 6.3 (H) 4.8 - 5.6 %   Est. average glucose Bld gHb Est-mCnc 134 mg/dL  Lipid Panel   Collection Time: 04/11/24  8:23 AM  Result Value Ref Range   Cholesterol, Total 158 100 - 199 mg/dL   Triglycerides 862 0 - 149 mg/dL   HDL 41 >60 mg/dL   VLDL Cholesterol Cal 24 5 - 40 mg/dL   LDL Chol Calc (NIH) 93 0 - 99 mg/dL   Chol/HDL Ratio 3.9 0.0 - 5.0 ratio  Hepatic Function Panel   Collection Time: 04/11/24  8:23 AM  Result Value Ref Range   Total Protein 6.8 6.0 - 8.5 g/dL   Albumin 4.6 3.8 - 4.8 g/dL   Bilirubin Total 0.5 0.0 - 1.2 mg/dL   Bilirubin, Direct 9.83 0.00 - 0.40 mg/dL   Alkaline Phosphatase 95 47 - 123 IU/L   AST 21 0 - 40 IU/L   ALT 27 0 - 44 IU/L  Basic Metabolic Panel   Collection Time: 04/11/24  8:23 AM  Result Value Ref Range   Glucose 131 (H) 70 - 99 mg/dL   BUN 17 8 - 27 mg/dL   Creatinine, Ser 8.94 0.76 - 1.27 mg/dL   eGFR 74 >40 fO/fpw/8.26    BUN/Creatinine Ratio 16 10 - 24   Sodium 141 134 - 144 mmol/L   Potassium 4.4 3.5 - 5.2 mmol/L   Chloride 105 96 - 106 mmol/L   CO2 24 20 - 29 mmol/L   Calcium 9.7 8.6 - 10.2 mg/dL  Microalbumin/Creatinine Ratio, Urine   Collection Time: 04/11/24  8:23 AM  Result Value Ref Range   Creatinine, Urine 81.9 Not Estab. mg/dL   Microalbumin, Urine 4.4 Not Estab. ug/mL   Microalb/Creat Ratio 5 0 - 29 mg/g creat    He is doing a good job of maintaining a healthy diet but he does admit recently the diet has had a lot more carbohydrates because of Christmas time He states he is getting get himself back into eating healthy on a regular basis He denies any major setbacks His bowels are moving well No blood in the bowel movement Urinating well No accidents or injuries Sleeping well Energy level good Consistent with his medicines General aches with the left knee but nothing severe lately Review of Systems     Objective:   Physical Exam  General-in no acute distress Eyes-no discharge Lungs-respiratory  rate normal, CTA CV-no murmurs,RRR Extremities skin warm dry no edema Neuro grossly normal Behavior normal, alert       Assessment & Plan:  1. Prediabetes (Primary) A1c reasonable control minimize carbohydrates we look at labs in 6 months  2. Essential hypertension, benign Blood pressure very good control continue current medications stay physically active minimize salt  3. Mixed hyperlipidemia Cholesterol reasonable good control continue statin tolerating well  4. Primary osteoarthritis of left knee Tylenol as needed  Follow-up 6 months "

## 2024-10-14 ENCOUNTER — Ambulatory Visit: Admitting: Family Medicine
